# Patient Record
Sex: Female | Born: 1960 | Race: White | Hispanic: No | Marital: Married | Smoking: Former smoker
Health system: Southern US, Community
[De-identification: ages and names within clinical notes are randomized; demographics above are authoritative.]

## PROBLEM LIST (undated history)

## (undated) DIAGNOSIS — R112 Nausea with vomiting, unspecified: Secondary | ICD-10-CM

## (undated) DIAGNOSIS — F419 Anxiety disorder, unspecified: Secondary | ICD-10-CM

## (undated) DIAGNOSIS — Z9889 Other specified postprocedural states: Secondary | ICD-10-CM

## (undated) DIAGNOSIS — K219 Gastro-esophageal reflux disease without esophagitis: Secondary | ICD-10-CM

## (undated) DIAGNOSIS — K635 Polyp of colon: Secondary | ICD-10-CM

## (undated) HISTORY — DX: Anxiety disorder, unspecified: F41.9

## (undated) HISTORY — DX: Polyp of colon: K63.5

## (undated) HISTORY — DX: Gastro-esophageal reflux disease without esophagitis: K21.9

---

## 1999-06-16 ENCOUNTER — Other Ambulatory Visit: Admission: RE | Admit: 1999-06-16 | Discharge: 1999-06-16 | Payer: Self-pay | Admitting: Gynecology

## 1999-07-05 ENCOUNTER — Encounter (INDEPENDENT_AMBULATORY_CARE_PROVIDER_SITE_OTHER): Payer: Self-pay

## 1999-07-05 ENCOUNTER — Ambulatory Visit (HOSPITAL_COMMUNITY): Admission: RE | Admit: 1999-07-05 | Discharge: 1999-07-05 | Payer: Self-pay | Admitting: Gastroenterology

## 2000-10-24 ENCOUNTER — Encounter: Admission: RE | Admit: 2000-10-24 | Discharge: 2000-10-24 | Payer: Self-pay | Admitting: Gynecology

## 2000-10-24 ENCOUNTER — Encounter: Payer: Self-pay | Admitting: Gynecology

## 2002-05-22 ENCOUNTER — Ambulatory Visit (HOSPITAL_COMMUNITY): Admission: RE | Admit: 2002-05-22 | Discharge: 2002-05-22 | Payer: Self-pay | Admitting: Gastroenterology

## 2002-06-19 ENCOUNTER — Encounter: Payer: Self-pay | Admitting: Gynecology

## 2002-06-19 ENCOUNTER — Encounter: Admission: RE | Admit: 2002-06-19 | Discharge: 2002-06-19 | Payer: Self-pay | Admitting: Gynecology

## 2003-09-15 ENCOUNTER — Other Ambulatory Visit: Admission: RE | Admit: 2003-09-15 | Discharge: 2003-09-15 | Payer: Self-pay | Admitting: Family Medicine

## 2003-09-18 ENCOUNTER — Encounter: Admission: RE | Admit: 2003-09-18 | Discharge: 2003-09-18 | Payer: Self-pay | Admitting: Family Medicine

## 2004-09-27 ENCOUNTER — Encounter: Admission: RE | Admit: 2004-09-27 | Discharge: 2004-09-27 | Payer: Self-pay | Admitting: Family Medicine

## 2004-11-18 ENCOUNTER — Ambulatory Visit (HOSPITAL_BASED_OUTPATIENT_CLINIC_OR_DEPARTMENT_OTHER): Admission: RE | Admit: 2004-11-18 | Discharge: 2004-11-18 | Payer: Self-pay | Admitting: Orthopedic Surgery

## 2005-02-10 ENCOUNTER — Other Ambulatory Visit: Admission: RE | Admit: 2005-02-10 | Discharge: 2005-02-10 | Payer: Self-pay | Admitting: Family Medicine

## 2005-11-15 ENCOUNTER — Encounter: Admission: RE | Admit: 2005-11-15 | Discharge: 2005-11-15 | Payer: Self-pay | Admitting: Family Medicine

## 2007-01-29 ENCOUNTER — Encounter: Admission: RE | Admit: 2007-01-29 | Discharge: 2007-01-29 | Payer: Self-pay | Admitting: Family Medicine

## 2007-08-07 ENCOUNTER — Other Ambulatory Visit: Admission: RE | Admit: 2007-08-07 | Discharge: 2007-08-07 | Payer: Self-pay | Admitting: Family Medicine

## 2008-03-31 ENCOUNTER — Encounter: Admission: RE | Admit: 2008-03-31 | Discharge: 2008-03-31 | Payer: Self-pay | Admitting: Family Medicine

## 2008-10-10 HISTORY — PX: PARTIAL KNEE ARTHROPLASTY: SHX2174

## 2009-03-18 ENCOUNTER — Other Ambulatory Visit: Admission: RE | Admit: 2009-03-18 | Discharge: 2009-03-18 | Payer: Self-pay | Admitting: Family Medicine

## 2009-04-07 ENCOUNTER — Encounter: Admission: RE | Admit: 2009-04-07 | Discharge: 2009-04-07 | Payer: Self-pay | Admitting: Family Medicine

## 2010-04-27 ENCOUNTER — Encounter: Admission: RE | Admit: 2010-04-27 | Discharge: 2010-04-27 | Payer: Self-pay | Admitting: Family Medicine

## 2010-04-29 ENCOUNTER — Encounter: Admission: RE | Admit: 2010-04-29 | Discharge: 2010-04-29 | Payer: Self-pay | Admitting: Family Medicine

## 2010-05-04 ENCOUNTER — Other Ambulatory Visit: Admission: RE | Admit: 2010-05-04 | Discharge: 2010-05-04 | Payer: Self-pay | Admitting: Family Medicine

## 2011-02-25 NOTE — Op Note (Signed)
NAMEJANESSA, [name]               ACCOUNT NO.:  [id number]   MEDICAL RECORD NO.:  [id number]          PATIENT TYPE:  AMB   LOCATION:  DSC                          FACILITY:  [facility]   PHYSICIAN:  Nadara Mustard, MD     DATE OF BIRTH:  1961-03-29   DATE OF PROCEDURE:  11/18/2004  DATE OF DISCHARGE:                                 OPERATIVE REPORT   PREOPERATIVE DIAGNOSIS:  Internal derangement, right knee.   POSTOPERATIVE DIAGNOSES:  1.  Medial meniscal tear, right knee.  2.  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   PROCEDURE:  1.  Partial medial meniscectomy, right knee.  2.  Abrasion chondroplasty with micro fracture of the medial femoral condyle      and medial tibial plateau.   SURGEON:  Nadara Mustard, M.D.   ANESTHESIA:  Knee block.   ESTIMATED BLOOD LOSS:  Minimal.   ANTIBIOTICS:  1 g of Kefzol.   TOURNIQUET TIME:  None.   DISPOSITION:  To PACU in stable condition.   INDICATION FOR PROCEDURE:  The patient is a 50 year old woman who is active  athletically with tennis who has been having increasing mechanical symptoms  in her right knee.  The patient has been unable to perform activities of  daily living due to right knee pain, MRI scan confirms medial joint line  pathology and she presents at this time for arthroscopic intervention.  The  risks and benefits were discussed, including infection, neurovascular  injury, persistent pain, and need for additional surgery.  The patient  states she understands and wishes to proceed at this time.   DESCRIPTION OF PROCEDURE:  The patient was brought to OR room 5 after  undergoing a knee block.  After an adequate level of anesthesia was  obtained, the patient's right lower extremity was prepped using Duraprep and  draped into a sterile field.  The scope was inserted through the  inferolateral portal and an inferomedial working portal was established.  Visualization of  the medial femoral condyle showed a large osteochondral  defect of the medial femoral condyle and medial tibial plateau with multiple  loose bodies measuring greater than 10 x 10 mm in the medial joint line.  The loose bodies were first removed and abrasion chondroplasty was performed  on the medial femoral condyle and medial tibial plateau.  The patient had  eburnated bone.  Micro fracture technique was then used and holes were made  in the medial femoral condyle and medial tibial plateau.  The pressure was  released from the scope and there was good bleeding from the micro fracture  holes.  The patient also had a partial tear of the medial meniscus and this  was also debrided.  Visualization of the notch showed an intact ACL.  Visualization of the lateral joint line in a figure-of-four position showed  an intact lateral joint line.  Visualization of the patellofemoral joint  showed synovitis but an intact patella and trochlea.  The synovitis was  debrided, the medial and lateral gutters  were evaluated and there were no  loose bodies.  A survey of all three compartments was again performed, the  vapor Mitek was used for hemostasis and further debridement of the  synovitis.  The instruments were removed, the portals were injected with a  total of 20 cc of 0.5% Marcaine plain and 4 mg of morphine.  The portals  were closed using 4-0 nylon.  The wounds were covered with Adaptic,  orthopedic sponges, sterile Webril, and Coban dressing.  The patient was  then taken to PACU in stable condition with followup in the office in 2  weeks.  She has a prescription for Vicodin and Tylox for pain.      MVD/MEDQ  D:  11/18/2004  T:  11/18/2004  Job:  [id number]

## 2011-04-06 ENCOUNTER — Other Ambulatory Visit: Payer: Self-pay | Admitting: Family Medicine

## 2011-04-06 DIAGNOSIS — Z1231 Encounter for screening mammogram for malignant neoplasm of breast: Secondary | ICD-10-CM

## 2011-05-03 ENCOUNTER — Ambulatory Visit
Admission: RE | Admit: 2011-05-03 | Discharge: 2011-05-03 | Disposition: A | Discharge status: Home / Self Care | Payer: 59 | Source: Ambulatory Visit | Attending: Family Medicine | Admitting: Family Medicine

## 2011-05-03 DIAGNOSIS — Z1231 Encounter for screening mammogram for malignant neoplasm of breast: Secondary | ICD-10-CM

## 2011-10-31 ENCOUNTER — Other Ambulatory Visit: Payer: Self-pay | Admitting: Physician Assistant

## 2011-10-31 ENCOUNTER — Other Ambulatory Visit (HOSPITAL_COMMUNITY)
Admission: RE | Admit: 2011-10-31 | Discharge: 2011-10-31 | Disposition: A | Discharge status: Home / Self Care | Payer: 59 | Source: Ambulatory Visit | Attending: Family Medicine | Admitting: Family Medicine

## 2011-10-31 DIAGNOSIS — Z124 Encounter for screening for malignant neoplasm of cervix: Secondary | ICD-10-CM | POA: Insufficient documentation

## 2012-07-03 ENCOUNTER — Other Ambulatory Visit: Payer: Self-pay | Admitting: Family Medicine

## 2012-07-03 DIAGNOSIS — Z1231 Encounter for screening mammogram for malignant neoplasm of breast: Secondary | ICD-10-CM

## 2012-07-06 ENCOUNTER — Ambulatory Visit
Admission: RE | Admit: 2012-07-06 | Discharge: 2012-07-06 | Disposition: A | Discharge status: Home / Self Care | Payer: BC Managed Care – PPO | Source: Ambulatory Visit | Attending: Family Medicine | Admitting: Family Medicine

## 2012-07-06 DIAGNOSIS — Z1231 Encounter for screening mammogram for malignant neoplasm of breast: Secondary | ICD-10-CM

## 2012-12-27 ENCOUNTER — Other Ambulatory Visit: Payer: Self-pay | Admitting: Dermatology

## 2012-12-31 ENCOUNTER — Other Ambulatory Visit: Payer: Self-pay | Admitting: Physician Assistant

## 2012-12-31 ENCOUNTER — Other Ambulatory Visit (HOSPITAL_COMMUNITY)
Admission: RE | Admit: 2012-12-31 | Discharge: 2012-12-31 | Disposition: A | Discharge status: Home / Self Care | Payer: BC Managed Care – PPO | Source: Ambulatory Visit | Attending: Family Medicine | Admitting: Family Medicine

## 2012-12-31 DIAGNOSIS — Z124 Encounter for screening for malignant neoplasm of cervix: Secondary | ICD-10-CM | POA: Insufficient documentation

## 2013-03-06 ENCOUNTER — Other Ambulatory Visit: Payer: Self-pay | Admitting: Orthopedic Surgery

## 2013-03-06 DIAGNOSIS — M542 Cervicalgia: Secondary | ICD-10-CM

## 2013-03-19 ENCOUNTER — Other Ambulatory Visit: Payer: BC Managed Care – PPO

## 2013-03-21 ENCOUNTER — Other Ambulatory Visit: Payer: BC Managed Care – PPO

## 2013-03-23 ENCOUNTER — Other Ambulatory Visit: Payer: BC Managed Care – PPO

## 2013-04-01 ENCOUNTER — Ambulatory Visit
Admission: RE | Admit: 2013-04-01 | Discharge: 2013-04-01 | Disposition: A | Discharge status: Home / Self Care | Payer: BC Managed Care – PPO | Source: Ambulatory Visit | Attending: Orthopedic Surgery | Admitting: Orthopedic Surgery

## 2013-04-01 DIAGNOSIS — M542 Cervicalgia: Secondary | ICD-10-CM

## 2013-06-24 ENCOUNTER — Other Ambulatory Visit: Payer: Self-pay

## 2013-06-24 DIAGNOSIS — Z1231 Encounter for screening mammogram for malignant neoplasm of breast: Secondary | ICD-10-CM

## 2013-07-10 ENCOUNTER — Ambulatory Visit: Payer: BC Managed Care – PPO

## 2013-07-31 ENCOUNTER — Ambulatory Visit
Admission: RE | Admit: 2013-07-31 | Discharge: 2013-07-31 | Disposition: A | Discharge status: Home / Self Care | Payer: BC Managed Care – PPO | Source: Ambulatory Visit

## 2013-07-31 DIAGNOSIS — Z1231 Encounter for screening mammogram for malignant neoplasm of breast: Secondary | ICD-10-CM

## 2013-12-24 ENCOUNTER — Other Ambulatory Visit: Payer: Self-pay | Admitting: Family Medicine

## 2013-12-24 ENCOUNTER — Ambulatory Visit
Admission: RE | Admit: 2013-12-24 | Discharge: 2013-12-24 | Disposition: A | Discharge status: Home / Self Care | Payer: 59 | Source: Ambulatory Visit | Attending: Family Medicine | Admitting: Family Medicine

## 2013-12-24 DIAGNOSIS — Z87891 Personal history of nicotine dependence: Secondary | ICD-10-CM

## 2013-12-24 DIAGNOSIS — R079 Chest pain, unspecified: Secondary | ICD-10-CM

## 2013-12-26 ENCOUNTER — Ambulatory Visit
Admission: RE | Admit: 2013-12-26 | Discharge: 2013-12-26 | Disposition: A | Discharge status: Home / Self Care | Payer: 59 | Source: Ambulatory Visit | Attending: Family Medicine | Admitting: Family Medicine

## 2013-12-26 ENCOUNTER — Other Ambulatory Visit: Payer: Self-pay | Admitting: Family Medicine

## 2013-12-26 DIAGNOSIS — R911 Solitary pulmonary nodule: Secondary | ICD-10-CM

## 2013-12-26 MED ORDER — IOHEXOL 300 MG/ML  SOLN
75.0000 mL | Freq: Once | INTRAMUSCULAR | Status: AC | PRN
  Administered 2013-12-26: 75 mL via INTRAVENOUS

## 2014-01-09 ENCOUNTER — Other Ambulatory Visit: Payer: Self-pay | Admitting: Dermatology

## 2014-01-23 ENCOUNTER — Encounter: Payer: Self-pay | Admitting: *Deleted

## 2014-01-23 DIAGNOSIS — F419 Anxiety disorder, unspecified: Secondary | ICD-10-CM | POA: Insufficient documentation

## 2014-02-11 ENCOUNTER — Other Ambulatory Visit: Payer: Self-pay | Admitting: Dermatology

## 2014-07-21 ENCOUNTER — Other Ambulatory Visit: Payer: Self-pay

## 2014-07-21 DIAGNOSIS — Z1231 Encounter for screening mammogram for malignant neoplasm of breast: Secondary | ICD-10-CM

## 2014-08-12 ENCOUNTER — Ambulatory Visit
Admission: RE | Admit: 2014-08-12 | Discharge: 2014-08-12 | Disposition: A | Discharge status: Home / Self Care | Payer: 59 | Source: Ambulatory Visit

## 2014-08-12 DIAGNOSIS — Z1231 Encounter for screening mammogram for malignant neoplasm of breast: Secondary | ICD-10-CM

## 2015-02-26 ENCOUNTER — Other Ambulatory Visit: Payer: Self-pay | Admitting: Gastroenterology

## 2015-07-21 ENCOUNTER — Other Ambulatory Visit: Payer: Self-pay

## 2015-07-21 DIAGNOSIS — Z1231 Encounter for screening mammogram for malignant neoplasm of breast: Secondary | ICD-10-CM

## 2015-08-18 ENCOUNTER — Ambulatory Visit
Admission: RE | Admit: 2015-08-18 | Discharge: 2015-08-18 | Disposition: A | Discharge status: Home / Self Care | Payer: 59 | Source: Ambulatory Visit

## 2015-08-18 DIAGNOSIS — Z1231 Encounter for screening mammogram for malignant neoplasm of breast: Secondary | ICD-10-CM

## 2016-07-13 ENCOUNTER — Encounter (HOSPITAL_BASED_OUTPATIENT_CLINIC_OR_DEPARTMENT_OTHER): Payer: Self-pay | Admitting: *Deleted

## 2016-07-15 ENCOUNTER — Ambulatory Visit: Payer: Self-pay | Admitting: Plastic Surgery

## 2016-07-15 DIAGNOSIS — M898X8 Other specified disorders of bone, other site: Secondary | ICD-10-CM

## 2016-07-15 NOTE — H&P (Signed)
[name] is an 55 y.o. female.   Chief Complaint: forehead / skull mass HPI: The patient is a 55 yrs old wf here for a lesion on her forehead.  She is not sure how long it has been present but it is getting larger and tender.  It is at the midline upper forehead area.  It is 2 cm in size, raised and very firm.  It is not movable.  There is no sign of infection or redness around the area.  She denies any trauma.  Nothing makes it better.  She is concerned about the increase in size.  Past Medical History:  Diagnosis Date  . Anxiety   . Colon polyps   . GERD (gastroesophageal reflux disease)   . PONV (postoperative nausea and vomiting)     Past Surgical History:  Procedure Laterality Date  . PARTIAL KNEE ARTHROPLASTY Right 2010    Family History  Problem Relation Age of Onset  . Family history unknown: Yes   Social History:  reports that she quit smoking about 6 years ago. She has never used smokeless tobacco. She reports that she does not drink alcohol or use drugs.  Allergies:  Allergies  Allergen Reactions  . Codeine   . Penicillins      (Not in a hospital admission)  No results found for this or any previous visit (from the past 48 hour(s)). No results found.  Review of Systems  Constitutional: Negative.   HENT: Negative.   Eyes: Negative.   Respiratory: Negative.   Cardiovascular: Negative.   Gastrointestinal: Negative.   Genitourinary: Negative.   Musculoskeletal: Negative.   Skin: Negative.   Neurological: Negative.   Psychiatric/Behavioral: Negative.     There were no vitals taken for this visit. Physical Exam  Constitutional: She is oriented to person, place, and time. She appears well-developed and well-nourished.  HENT:  Head: Normocephalic and atraumatic.  Eyes: Conjunctivae and EOM are normal. Pupils are equal, round, and reactive to light.  Respiratory: Effort normal.  Neurological: She is alert and oriented to person, place, and time.   Skin: Skin is warm.  Psychiatric: She has a normal mood and affect. Her behavior is normal. Judgment and thought content normal.     Assessment/Plan Excision of skull / forehead mass  Wallace Going, DO 07/15/2016, 1:34 PM

## 2016-07-20 ENCOUNTER — Encounter (HOSPITAL_BASED_OUTPATIENT_CLINIC_OR_DEPARTMENT_OTHER): Payer: Self-pay | Admitting: *Deleted

## 2016-07-20 ENCOUNTER — Ambulatory Visit (HOSPITAL_BASED_OUTPATIENT_CLINIC_OR_DEPARTMENT_OTHER): Payer: 59 | Admitting: Anesthesiology

## 2016-07-20 ENCOUNTER — Ambulatory Visit (HOSPITAL_BASED_OUTPATIENT_CLINIC_OR_DEPARTMENT_OTHER)
Admission: RE | Admit: 2016-07-20 | Discharge: 2016-07-20 | Disposition: A | Discharge status: Home / Self Care | Payer: 59 | Source: Ambulatory Visit | Attending: Plastic Surgery | Admitting: Plastic Surgery

## 2016-07-20 ENCOUNTER — Encounter (HOSPITAL_BASED_OUTPATIENT_CLINIC_OR_DEPARTMENT_OTHER)
Admission: RE | Disposition: A | Discharge status: Home / Self Care | Payer: Self-pay | Source: Ambulatory Visit | Attending: Plastic Surgery

## 2016-07-20 DIAGNOSIS — E65 Localized adiposity: Secondary | ICD-10-CM | POA: Diagnosis not present

## 2016-07-20 DIAGNOSIS — Z87891 Personal history of nicotine dependence: Secondary | ICD-10-CM | POA: Insufficient documentation

## 2016-07-20 DIAGNOSIS — R220 Localized swelling, mass and lump, head: Secondary | ICD-10-CM | POA: Diagnosis present

## 2016-07-20 DIAGNOSIS — K219 Gastro-esophageal reflux disease without esophagitis: Secondary | ICD-10-CM | POA: Insufficient documentation

## 2016-07-20 DIAGNOSIS — M898X8 Other specified disorders of bone, other site: Secondary | ICD-10-CM

## 2016-07-20 HISTORY — PX: MASS EXCISION: SHX2000

## 2016-07-20 HISTORY — DX: Nausea with vomiting, unspecified: R11.2

## 2016-07-20 HISTORY — DX: Other specified postprocedural states: Z98.890

## 2016-07-20 SURGERY — EXCISION MASS
Anesthesia: General | Laterality: Right

## 2016-07-20 MED ORDER — LIDOCAINE-EPINEPHRINE 1 %-1:100000 IJ SOLN
INTRAMUSCULAR | Status: DC | PRN
  Administered 2016-07-20: 1 mL

## 2016-07-20 MED ORDER — CIPROFLOXACIN IN D5W 400 MG/200ML IV SOLN
INTRAVENOUS | Status: AC
  Filled 2016-07-20: qty 200

## 2016-07-20 MED ORDER — LIDOCAINE 2% (20 MG/ML) 5 ML SYRINGE
INTRAMUSCULAR | Status: AC
  Filled 2016-07-20: qty 5

## 2016-07-20 MED ORDER — GLYCOPYRROLATE 0.2 MG/ML IJ SOLN: 0.2000 mg | Freq: Once | INTRAMUSCULAR | Status: DC | PRN

## 2016-07-20 MED ORDER — CIPROFLOXACIN IN D5W 400 MG/200ML IV SOLN
400.0000 mg | INTRAVENOUS | Status: AC
  Administered 2016-07-20: 400 mg via INTRAVENOUS

## 2016-07-20 MED ORDER — SCOPOLAMINE 1 MG/3DAYS TD PT72: 1.0000 | MEDICATED_PATCH | Freq: Once | TRANSDERMAL | Status: DC | PRN

## 2016-07-20 MED ORDER — ONDANSETRON HCL 4 MG/2ML IJ SOLN
INTRAMUSCULAR | Status: DC | PRN
  Administered 2016-07-20: 4 mg via INTRAVENOUS

## 2016-07-20 MED ORDER — DEXAMETHASONE SODIUM PHOSPHATE 10 MG/ML IJ SOLN
INTRAMUSCULAR | Status: AC
  Filled 2016-07-20: qty 1

## 2016-07-20 MED ORDER — LIDOCAINE HCL (CARDIAC) 20 MG/ML IV SOLN
INTRAVENOUS | Status: DC | PRN
  Administered 2016-07-20: 60 mg via INTRAVENOUS

## 2016-07-20 MED ORDER — ONDANSETRON HCL 4 MG/2ML IJ SOLN
INTRAMUSCULAR | Status: AC
  Filled 2016-07-20: qty 2

## 2016-07-20 MED ORDER — MIDAZOLAM HCL 2 MG/2ML IJ SOLN
1.0000 mg | INTRAMUSCULAR | Status: DC | PRN
  Administered 2016-07-20: 2 mg via INTRAVENOUS

## 2016-07-20 MED ORDER — DEXAMETHASONE SODIUM PHOSPHATE 4 MG/ML IJ SOLN
INTRAMUSCULAR | Status: DC | PRN
  Administered 2016-07-20: 10 mg via INTRAVENOUS

## 2016-07-20 MED ORDER — ONDANSETRON HCL 4 MG/2ML IJ SOLN
4.0000 mg | Freq: Once | INTRAMUSCULAR | Status: DC | PRN
Start: 2016-07-20 — End: 2016-07-20

## 2016-07-20 MED ORDER — MIDAZOLAM HCL 2 MG/2ML IJ SOLN
INTRAMUSCULAR | Status: AC
  Filled 2016-07-20: qty 2

## 2016-07-20 MED ORDER — CHLORHEXIDINE GLUCONATE CLOTH 2 % EX PADS: 6.0000 | MEDICATED_PAD | Freq: Once | CUTANEOUS | Status: DC

## 2016-07-20 MED ORDER — PROPOFOL 10 MG/ML IV BOLUS
INTRAVENOUS | Status: AC
  Filled 2016-07-20: qty 20

## 2016-07-20 MED ORDER — HYDROMORPHONE HCL 1 MG/ML IJ SOLN: 0.2500 mg | INTRAMUSCULAR | Status: DC | PRN

## 2016-07-20 MED ORDER — FENTANYL CITRATE (PF) 100 MCG/2ML IJ SOLN
50.0000 ug | INTRAMUSCULAR | Status: DC | PRN
  Administered 2016-07-20: 100 ug via INTRAVENOUS

## 2016-07-20 MED ORDER — FENTANYL CITRATE (PF) 100 MCG/2ML IJ SOLN
INTRAMUSCULAR | Status: AC
  Filled 2016-07-20: qty 2

## 2016-07-20 MED ORDER — OXYCODONE HCL 5 MG PO TABS: 5.0000 mg | ORAL_TABLET | Freq: Once | ORAL | Status: DC | PRN

## 2016-07-20 MED ORDER — LACTATED RINGERS IV SOLN
INTRAVENOUS | Status: DC
  Administered 2016-07-20 (×2): via INTRAVENOUS

## 2016-07-20 MED ORDER — PROPOFOL 10 MG/ML IV BOLUS
INTRAVENOUS | Status: DC | PRN
  Administered 2016-07-20: 200 mg via INTRAVENOUS

## 2016-07-20 MED ORDER — OXYCODONE HCL 5 MG/5ML PO SOLN: 5.0000 mg | Freq: Once | ORAL | Status: DC | PRN

## 2016-07-20 MED ORDER — MEPERIDINE HCL 25 MG/ML IJ SOLN: 6.2500 mg | INTRAMUSCULAR | Status: DC | PRN

## 2016-07-20 SURGICAL SUPPLY — 65 items
ADH SKN CLS APL DERMABOND .7 (GAUZE/BANDAGES/DRESSINGS)
BENZOIN TINCTURE PRP APPL 2/3 (GAUZE/BANDAGES/DRESSINGS) IMPLANT
BLADE CLIPPER SURG (BLADE) IMPLANT
BLADE SURG 15 STRL LF DISP TIS (BLADE) ×1 IMPLANT
BLADE SURG 15 STRL SS (BLADE) ×3
BNDG CONFORM 2 STRL LF (GAUZE/BANDAGES/DRESSINGS) IMPLANT
BNDG ELASTIC 2X5.8 VLCR STR LF (GAUZE/BANDAGES/DRESSINGS) IMPLANT
CANISTER SUCT 1200ML W/VALVE (MISCELLANEOUS) IMPLANT
CHLORAPREP W/TINT 26ML (MISCELLANEOUS) IMPLANT
CLEANER CAUTERY TIP 5X5 PAD (MISCELLANEOUS) IMPLANT
CLOSURE WOUND 1/2 X4 (GAUZE/BANDAGES/DRESSINGS)
CORDS BIPOLAR (ELECTRODE) IMPLANT
COVER BACK TABLE 60X90IN (DRAPES) ×3 IMPLANT
COVER MAYO STAND STRL (DRAPES) ×3 IMPLANT
DECANTER SPIKE VIAL GLASS SM (MISCELLANEOUS) IMPLANT
DERMABOND ADVANCED (GAUZE/BANDAGES/DRESSINGS)
DERMABOND ADVANCED .7 DNX12 (GAUZE/BANDAGES/DRESSINGS) IMPLANT
DRAPE LAPAROTOMY 100X72 PEDS (DRAPES) IMPLANT
DRAPE U-SHAPE 76X120 STRL (DRAPES) ×3 IMPLANT
DRSG TEGADERM 2-3/8X2-3/4 SM (GAUZE/BANDAGES/DRESSINGS) IMPLANT
DRSG TEGADERM 4X4.75 (GAUZE/BANDAGES/DRESSINGS) IMPLANT
ELECT COATED BLADE 2.86 ST (ELECTRODE) IMPLANT
ELECT NEEDLE BLADE 2-5/6 (NEEDLE) IMPLANT
ELECT REM PT RETURN 9FT ADLT (ELECTROSURGICAL)
ELECT REM PT RETURN 9FT PED (ELECTROSURGICAL)
ELECTRODE REM PT RETRN 9FT PED (ELECTROSURGICAL) IMPLANT
ELECTRODE REM PT RTRN 9FT ADLT (ELECTROSURGICAL) IMPLANT
GLOVE BIO SURGEON STRL SZ 6.5 (GLOVE) ×4 IMPLANT
GLOVE BIO SURGEONS STRL SZ 6.5 (GLOVE) ×2
GOWN STRL REUS W/ TWL LRG LVL3 (GOWN DISPOSABLE) ×2 IMPLANT
GOWN STRL REUS W/TWL LRG LVL3 (GOWN DISPOSABLE) ×6
NEEDLE HYPO 30GX1 BEV (NEEDLE) IMPLANT
NEEDLE PRECISIONGLIDE 27X1.5 (NEEDLE) ×3 IMPLANT
NS IRRIG 1000ML POUR BTL (IV SOLUTION) IMPLANT
PACK BASIN DAY SURGERY FS (CUSTOM PROCEDURE TRAY) ×3 IMPLANT
PAD CLEANER CAUTERY TIP 5X5 (MISCELLANEOUS)
PENCIL BUTTON HOLSTER BLD 10FT (ELECTRODE) IMPLANT
RUBBERBAND STERILE (MISCELLANEOUS) IMPLANT
SHEET MEDIUM DRAPE 40X70 STRL (DRAPES) IMPLANT
SLEEVE SCD COMPRESS KNEE MED (MISCELLANEOUS) IMPLANT
SPONGE GAUZE 2X2 8PLY STER LF (GAUZE/BANDAGES/DRESSINGS)
SPONGE GAUZE 2X2 8PLY STRL LF (GAUZE/BANDAGES/DRESSINGS) IMPLANT
SPONGE GAUZE 4X4 12PLY STER LF (GAUZE/BANDAGES/DRESSINGS) IMPLANT
STRIP CLOSURE SKIN 1/2X4 (GAUZE/BANDAGES/DRESSINGS) IMPLANT
SUCTION FRAZIER HANDLE 10FR (MISCELLANEOUS)
SUCTION TUBE FRAZIER 10FR DISP (MISCELLANEOUS) IMPLANT
SUT MNCRL 6-0 UNDY P1 1X18 (SUTURE) IMPLANT
SUT MNCRL AB 3-0 PS2 18 (SUTURE) IMPLANT
SUT MNCRL AB 4-0 PS2 18 (SUTURE) IMPLANT
SUT MON AB 5-0 P3 18 (SUTURE) IMPLANT
SUT MON AB 5-0 PS2 18 (SUTURE) IMPLANT
SUT MONOCRYL 6-0 P1 1X18 (SUTURE)
SUT PROLENE 5 0 P 3 (SUTURE) IMPLANT
SUT PROLENE 5 0 PS 2 (SUTURE) IMPLANT
SUT PROLENE 6 0 P 1 18 (SUTURE) IMPLANT
SUT VIC AB 5-0 P-3 18X BRD (SUTURE) IMPLANT
SUT VIC AB 5-0 P3 18 (SUTURE)
SUT VIC AB 5-0 PS2 18 (SUTURE) IMPLANT
SUT VICRYL 4-0 PS2 18IN ABS (SUTURE) IMPLANT
SYR BULB 3OZ (MISCELLANEOUS) IMPLANT
SYR CONTROL 10ML LL (SYRINGE) ×3 IMPLANT
TOWEL OR 17X24 6PK STRL BLUE (TOWEL DISPOSABLE) ×3 IMPLANT
TRAY DSU PREP LF (CUSTOM PROCEDURE TRAY) ×3 IMPLANT
TUBE CONNECTING 20'X1/4 (TUBING)
TUBE CONNECTING 20X1/4 (TUBING) IMPLANT

## 2016-07-20 NOTE — Interval H&P Note (Signed)
History and Physical Interval Note:  07/20/2016 12:25 PM  [name]  has presented today for surgery, with the diagnosis of FOREHEAD MASS  The various methods of treatment have been discussed with the patient and family. After consideration of risks, benefits and other options for treatment, the patient has consented to  Procedure(s): EXCISION  OF FOREHEAD BONE MASS (Right) as a surgical intervention .  The patient's history has been reviewed, patient examined, no change in status, stable for surgery.  I have reviewed the patient's chart and labs.  Questions were answered to the patient's satisfaction.     Wallace Going

## 2016-07-20 NOTE — H&P (View-Only) (Signed)
[name] is an 55 y.o. female.   Chief Complaint: forehead / skull mass HPI: The patient is a 55 yrs old wf here for a lesion on her forehead.  She is not sure how long it has been present but it is getting larger and tender.  It is at the midline upper forehead area.  It is 2 cm in size, raised and very firm.  It is not movable.  There is no sign of infection or redness around the area.  She denies any trauma.  Nothing makes it better.  She is concerned about the increase in size.  Past Medical History:  Diagnosis Date  . Anxiety   . Colon polyps   . GERD (gastroesophageal reflux disease)   . PONV (postoperative nausea and vomiting)     Past Surgical History:  Procedure Laterality Date  . PARTIAL KNEE ARTHROPLASTY Right 2010    Family History  Problem Relation Age of Onset  . Family history unknown: Yes   Social History:  reports that she quit smoking about 6 years ago. She has never used smokeless tobacco. She reports that she does not drink alcohol or use drugs.  Allergies:  Allergies  Allergen Reactions  . Codeine   . Penicillins      (Not in a hospital admission)  No results found for this or any previous visit (from the past 48 hour(s)). No results found.  Review of Systems  Constitutional: Negative.   HENT: Negative.   Eyes: Negative.   Respiratory: Negative.   Cardiovascular: Negative.   Gastrointestinal: Negative.   Genitourinary: Negative.   Musculoskeletal: Negative.   Skin: Negative.   Neurological: Negative.   Psychiatric/Behavioral: Negative.     There were no vitals taken for this visit. Physical Exam  Constitutional: She is oriented to person, place, and time. She appears well-developed and well-nourished.  HENT:  Head: Normocephalic and atraumatic.  Eyes: Conjunctivae and EOM are normal. Pupils are equal, round, and reactive to light.  Respiratory: Effort normal.  Neurological: She is alert and oriented to person, place, and time.   Skin: Skin is warm.  Psychiatric: She has a normal mood and affect. Her behavior is normal. Judgment and thought content normal.     Assessment/Plan Excision of skull / forehead mass  Wallace Going, DO 07/15/2016, 1:34 PM

## 2016-07-20 NOTE — Anesthesia Procedure Notes (Signed)
Procedure Name: LMA Insertion Date/Time: 07/20/2016 12:54 PM Performed by: Maryella Shivers Pre-anesthesia Checklist: Patient identified, Emergency Drugs available, Suction available and Patient being monitored Patient Re-evaluated:Patient Re-evaluated prior to inductionOxygen Delivery Method: Circle system utilized Preoxygenation: Pre-oxygenation with 100% oxygen Intubation Type: IV induction Ventilation: Mask ventilation without difficulty LMA: LMA flexible inserted LMA Size: 4.0 Number of attempts: 1 Airway Equipment and Method: Bite block Placement Confirmation: positive ETCO2 Tube secured with: Tape Dental Injury: Teeth and Oropharynx as per pre-operative assessment

## 2016-07-20 NOTE — Op Note (Signed)
DATE OF OPERATION: 07/20/2016  LOCATION: [facility]  PREOPERATIVE DIAGNOSIS: forehead mass  POSTOPERATIVE DIAGNOSIS: Same  PROCEDURE: Excision of bone forehead mass 1.5 cm  SURGEON: Krishav Mamone Sanger Bessie Boyte, DO  ASSISTANT: Shawn Rayburn, PA  EBL: minimal  CONDITION: Stable  COMPLICATIONS: None  INDICATION: The patient, [name], is a 55 y.o. female born on 1961-09-12, is here for treatment a chronic bump on her forehead.  It started to get larger and tender.   PROCEDURE DETAILS:  The patient was seen on the morning of her surgery and marked out for her flap.  The IV antibiotics were given. The patient was taken to the operating room and given a general anesthetic. A standard time out was performed and all information was confirmed by those in the room. SCDs were placed.   The local was injected in the skin at the site.  The #15 blade was used to make an incision over the lesion.  The dingman was used to dissect around the 1.5 cm lesion.  It was hard and felt like bone.  The bone lifter was used to free it from the skull.  The osteotome was used to be sure the remaining bone was flat.  There was no intracranial connection or softness of the bone. The 6-0 Monocryl was used to close the skin with a running subcuticular stitch.  Dermabond and a steri strip was applied.The patient was allowed to wake up and taken to recovery room in stable condition at the end of the case. The family was notified at the end of the case.

## 2016-07-20 NOTE — Anesthesia Postprocedure Evaluation (Signed)
Anesthesia Post Note  Patient: [name]  Procedure(s) Performed: Procedure(s) (LRB): EXCISION  OF FOREHEAD BONE MASS (Right)  Patient location during evaluation: PACU Anesthesia Type: General Level of consciousness: awake and alert Pain management: pain level controlled Vital Signs Assessment: post-procedure vital signs reviewed and stable Respiratory status: spontaneous breathing, nonlabored ventilation and respiratory function stable Cardiovascular status: blood pressure returned to baseline and stable Postop Assessment: no signs of nausea or vomiting Anesthetic complications: no    Last Vitals:  Vitals:   07/20/16 1345 07/20/16 1400  BP: 116/74 121/76  Pulse: (!) 59 70  Resp: 13 11  Temp:      Last Pain:  Vitals:   07/20/16 1330  TempSrc:   PainSc: 0-No pain                 Jona Zappone A

## 2016-07-20 NOTE — Brief Op Note (Signed)
07/20/2016  12:53 PM  PATIENT:  [name]  55 y.o. female  PRE-OPERATIVE DIAGNOSIS:  FOREHEAD MASS  POST-OPERATIVE DIAGNOSIS:  FOREHEAD MASS  PROCEDURE:  Procedure(s): EXCISION  OF FOREHEAD BONE MASS (Right)  SURGEON:  Surgeon(s) and Role:    * Loel Lofty Grayson White, DO - Primary  PHYSICIAN ASSISTANT:  Shawn Rayburn, PA  ASSISTANTS: none   ANESTHESIA:   local and general  EBL:  No intake/output data recorded.  BLOOD ADMINISTERED:none  DRAINS: none   LOCAL MEDICATIONS USED:  LIDOCAINE   SPECIMEN:  Source of Specimen:  forehead bone  DISPOSITION OF SPECIMEN:  PATHOLOGY  COUNTS:  YES  TOURNIQUET:  * No tourniquets in log *  DICTATION: .Dragon Dictation  PLAN OF CARE: Discharge to home after PACU  PATIENT DISPOSITION:  PACU - hemodynamically stable.   Delay start of Pharmacological VTE agent (>24hrs) due to surgical blood loss or risk of bleeding: no

## 2016-07-20 NOTE — Anesthesia Preprocedure Evaluation (Signed)
Anesthesia Evaluation  Patient identified by MRN, date of birth, ID band Patient awake    Reviewed: Allergy & Precautions  Airway Mallampati: I  TM Distance: >3 FB Neck ROM: Full    Dental  (+) Teeth Intact, Dental Advisory Given   Pulmonary former smoker,    breath sounds clear to auscultation       Cardiovascular  Rhythm:Regular Rate:Normal     Neuro/Psych    GI/Hepatic GERD  Medicated and Controlled,  Endo/Other    Renal/GU      Musculoskeletal   Abdominal   Peds  Hematology   Anesthesia Other Findings PONV was when she had a C section.  Reproductive/Obstetrics                             Anesthesia Physical Anesthesia Plan  ASA: II  Anesthesia Plan: General   Post-op Pain Management:    Induction: Intravenous  Airway Management Planned: LMA  Additional Equipment:   Intra-op Plan:   Post-operative Plan: Extubation in OR  Informed Consent: I have reviewed the patients History and Physical, chart, labs and discussed the procedure including the risks, benefits and alternatives for the proposed anesthesia with the patient or authorized representative who has indicated his/her understanding and acceptance.   Dental advisory given  Plan Discussed with: CRNA, Anesthesiologist and Surgeon  Anesthesia Plan Comments:         Anesthesia Quick Evaluation

## 2016-07-20 NOTE — Transfer of Care (Signed)
Immediate Anesthesia Transfer of Care Note  Patient: [name]  Procedure(s) Performed: Procedure(s): EXCISION  OF FOREHEAD BONE MASS (Right)  Patient Location: PACU  Anesthesia Type:General  Level of Consciousness: sedated  Airway & Oxygen Therapy: Patient Spontanous Breathing and Patient connected to face mask oxygen  Post-op Assessment: Report given to RN and Post -op Vital signs reviewed and stable  Post vital signs: Reviewed and stable  Last Vitals:  Vitals:   07/20/16 1152  BP: 138/82  Pulse: 77  Resp: 18  Temp: 36.5 C    Last Pain:  Vitals:   07/20/16 1152  TempSrc: Oral         Complications: No apparent anesthesia complications

## 2016-07-20 NOTE — Discharge Instructions (Signed)
Head of bed elevated No heavy lifting Ice pack as able for today   Post Anesthesia Home Care Instructions  Activity: Get plenty of rest for the remainder of the day. A responsible adult should stay with you for 24 hours following the procedure.  For the next 24 hours, DO NOT: -Drive a car -Paediatric nurse -Drink alcoholic beverages -Take any medication unless instructed by your physician -Make any legal decisions or sign important papers.  Meals: Start with liquid foods such as gelatin or soup. Progress to regular foods as tolerated. Avoid greasy, spicy, heavy foods. If nausea and/or vomiting occur, drink only clear liquids until the nausea and/or vomiting subsides. Call your physician if vomiting continues.  Special Instructions/Symptoms: Your throat may feel dry or sore from the anesthesia or the breathing tube placed in your throat during surgery. If this causes discomfort, gargle with warm salt water. The discomfort should disappear within 24 hours.  If you had a scopolamine patch placed behind your ear for the management of post- operative nausea and/or vomiting:  1. The medication in the patch is effective for 72 hours, after which it should be removed.  Wrap patch in a tissue and discard in the trash. Wash hands thoroughly with soap and water. 2. You may remove the patch earlier than 72 hours if you experience unpleasant side effects which may include dry mouth, dizziness or visual disturbances. 3. Avoid touching the patch. Wash your hands with soap and water after contact with the patch.

## 2016-07-21 ENCOUNTER — Encounter (HOSPITAL_BASED_OUTPATIENT_CLINIC_OR_DEPARTMENT_OTHER): Payer: Self-pay | Admitting: Plastic Surgery

## 2016-07-29 ENCOUNTER — Other Ambulatory Visit: Payer: Self-pay | Admitting: Family Medicine

## 2016-07-29 DIAGNOSIS — Z1231 Encounter for screening mammogram for malignant neoplasm of breast: Secondary | ICD-10-CM

## 2016-08-15 ENCOUNTER — Other Ambulatory Visit: Payer: Self-pay | Admitting: Family Medicine

## 2016-08-15 ENCOUNTER — Other Ambulatory Visit (HOSPITAL_COMMUNITY)
Admission: RE | Admit: 2016-08-15 | Discharge: 2016-08-15 | Disposition: A | Discharge status: Home / Self Care | Payer: 59 | Source: Ambulatory Visit | Attending: Family Medicine | Admitting: Family Medicine

## 2016-08-15 DIAGNOSIS — Z124 Encounter for screening for malignant neoplasm of cervix: Secondary | ICD-10-CM | POA: Diagnosis not present

## 2016-08-17 LAB — CYTOLOGY - PAP: DIAGNOSIS: NEGATIVE

## 2016-08-23 ENCOUNTER — Ambulatory Visit
Admission: RE | Admit: 2016-08-23 | Discharge: 2016-08-23 | Disposition: A | Discharge status: Home / Self Care | Payer: 59 | Source: Ambulatory Visit | Attending: Family Medicine | Admitting: Family Medicine

## 2016-08-23 DIAGNOSIS — Z1231 Encounter for screening mammogram for malignant neoplasm of breast: Secondary | ICD-10-CM

## 2016-12-01 ENCOUNTER — Other Ambulatory Visit: Payer: Self-pay | Admitting: Ophthalmology

## 2016-12-01 DIAGNOSIS — S0011XA Contusion of right eyelid and periocular area, initial encounter: Secondary | ICD-10-CM

## 2016-12-06 ENCOUNTER — Other Ambulatory Visit: Payer: Self-pay | Admitting: Ophthalmology

## 2016-12-06 DIAGNOSIS — S0011XA Contusion of right eyelid and periocular area, initial encounter: Secondary | ICD-10-CM

## 2016-12-06 DIAGNOSIS — D181 Lymphangioma, any site: Secondary | ICD-10-CM

## 2016-12-07 ENCOUNTER — Ambulatory Visit
Admission: RE | Admit: 2016-12-07 | Discharge: 2016-12-07 | Disposition: A | Discharge status: Home / Self Care | Payer: 59 | Source: Ambulatory Visit | Attending: Ophthalmology | Admitting: Ophthalmology

## 2016-12-07 DIAGNOSIS — S0011XA Contusion of right eyelid and periocular area, initial encounter: Secondary | ICD-10-CM

## 2016-12-07 DIAGNOSIS — D181 Lymphangioma, any site: Secondary | ICD-10-CM

## 2016-12-07 MED ORDER — IOPAMIDOL (ISOVUE-300) INJECTION 61%
75.0000 mL | Freq: Once | INTRAVENOUS | Status: AC | PRN
  Administered 2016-12-07: 75 mL via INTRAVENOUS

## 2017-07-19 ENCOUNTER — Other Ambulatory Visit: Payer: Self-pay | Admitting: Family Medicine

## 2017-07-19 DIAGNOSIS — Z1231 Encounter for screening mammogram for malignant neoplasm of breast: Secondary | ICD-10-CM

## 2017-08-24 ENCOUNTER — Ambulatory Visit
Admission: RE | Admit: 2017-08-24 | Discharge: 2017-08-24 | Disposition: A | Discharge status: Home / Self Care | Payer: 59 | Source: Ambulatory Visit | Attending: Family Medicine | Admitting: Family Medicine

## 2017-08-24 DIAGNOSIS — Z1231 Encounter for screening mammogram for malignant neoplasm of breast: Secondary | ICD-10-CM

## 2018-07-25 ENCOUNTER — Other Ambulatory Visit: Payer: Self-pay | Admitting: Family Medicine

## 2018-07-25 DIAGNOSIS — Z1231 Encounter for screening mammogram for malignant neoplasm of breast: Secondary | ICD-10-CM

## 2018-08-30 ENCOUNTER — Ambulatory Visit
Admission: RE | Admit: 2018-08-30 | Discharge: 2018-08-30 | Disposition: A | Discharge status: Home / Self Care | Payer: 59 | Source: Ambulatory Visit | Attending: Family Medicine | Admitting: Family Medicine

## 2018-08-30 DIAGNOSIS — Z1231 Encounter for screening mammogram for malignant neoplasm of breast: Secondary | ICD-10-CM

## 2019-06-18 ENCOUNTER — Other Ambulatory Visit: Payer: Self-pay

## 2019-06-18 DIAGNOSIS — Z20822 Contact with and (suspected) exposure to covid-19: Secondary | ICD-10-CM

## 2019-06-20 LAB — NOVEL CORONAVIRUS, NAA: SARS-CoV-2, NAA: NOT DETECTED

## 2019-08-13 ENCOUNTER — Other Ambulatory Visit: Payer: Self-pay

## 2019-08-13 DIAGNOSIS — Z20822 Contact with and (suspected) exposure to covid-19: Secondary | ICD-10-CM

## 2019-08-15 LAB — NOVEL CORONAVIRUS, NAA: SARS-CoV-2, NAA: NOT DETECTED

## 2019-09-11 ENCOUNTER — Other Ambulatory Visit: Payer: Self-pay | Admitting: Family Medicine

## 2019-09-11 DIAGNOSIS — Z1231 Encounter for screening mammogram for malignant neoplasm of breast: Secondary | ICD-10-CM

## 2019-10-15 ENCOUNTER — Ambulatory Visit: Payer: PRIVATE HEALTH INSURANCE | Attending: Internal Medicine

## 2019-10-15 DIAGNOSIS — Z20822 Contact with and (suspected) exposure to covid-19: Secondary | ICD-10-CM

## 2019-10-15 DIAGNOSIS — U071 COVID-19: Secondary | ICD-10-CM | POA: Insufficient documentation

## 2019-10-16 LAB — NOVEL CORONAVIRUS, NAA: SARS-CoV-2, NAA: DETECTED — AB

## 2019-10-31 ENCOUNTER — Other Ambulatory Visit: Payer: Self-pay

## 2019-10-31 ENCOUNTER — Ambulatory Visit
Admission: RE | Admit: 2019-10-31 | Discharge: 2019-10-31 | Disposition: A | Discharge status: Home / Self Care | Payer: PRIVATE HEALTH INSURANCE | Source: Ambulatory Visit | Attending: Family Medicine | Admitting: Family Medicine

## 2019-10-31 DIAGNOSIS — Z1231 Encounter for screening mammogram for malignant neoplasm of breast: Secondary | ICD-10-CM

## 2019-12-20 ENCOUNTER — Ambulatory Visit: Payer: 59 | Attending: Internal Medicine

## 2019-12-20 DIAGNOSIS — Z20822 Contact with and (suspected) exposure to covid-19: Secondary | ICD-10-CM

## 2019-12-21 LAB — NOVEL CORONAVIRUS, NAA: SARS-CoV-2, NAA: NOT DETECTED

## 2020-01-20 ENCOUNTER — Ambulatory Visit: Payer: Self-pay | Attending: Internal Medicine

## 2020-01-20 DIAGNOSIS — Z23 Encounter for immunization: Secondary | ICD-10-CM

## 2020-01-20 NOTE — Progress Notes (Signed)
   Covid-19 Vaccination Clinic  Name:  [name]    MRN: [id number] DOB: Aug 04, 1961  01/20/2020  [name] was observed post Covid-19 immunization for 30 minutes based on pre-vaccination screening without incident. She was provided with Vaccine Information Sheet and instruction to access the V-Safe system.   [name] was instructed to call 911 with any severe reactions post vaccine: [name] Kitchen Difficulty breathing  . Swelling of face and throat  . A fast heartbeat  . A bad rash all over body  . Dizziness and weakness   Immunizations Administered    Name Date Dose VIS Date Route   Pfizer COVID-19 Vaccine 01/20/2020 12:03 PM 0.3 mL 09/20/2019 Intramuscular   Manufacturer: [place]   Lot: [id number]   [place]: [id number]

## 2020-02-17 ENCOUNTER — Ambulatory Visit: Payer: Self-pay | Attending: Internal Medicine

## 2020-02-17 DIAGNOSIS — Z23 Encounter for immunization: Secondary | ICD-10-CM

## 2020-02-17 NOTE — Progress Notes (Signed)
   Covid-19 Vaccination Clinic  Name:  [name]    MRN: [id number] DOB: Mar 24, 1961  02/17/2020  [name] was observed post Covid-19 immunization for 15 minutes without incident. She was provided with Vaccine Information Sheet and instruction to access the V-Safe system.   [name] was instructed to call 911 with any severe reactions post vaccine: [name] Kitchen Difficulty breathing  . Swelling of face and throat  . A fast heartbeat  . A bad rash all over body  . Dizziness and weakness   Immunizations Administered    Name Date Dose VIS Date Route   Pfizer COVID-19 Vaccine 02/17/2020 10:16 AM 0.3 mL 12/04/2018 Intramuscular   Manufacturer: [place]   Lot: [id number]   [place]: [id number]

## 2020-05-11 ENCOUNTER — Other Ambulatory Visit (HOSPITAL_COMMUNITY)
Admission: RE | Admit: 2020-05-11 | Discharge: 2020-05-11 | Disposition: A | Discharge status: Home / Self Care | Payer: Self-pay | Source: Ambulatory Visit | Attending: Family Medicine | Admitting: Family Medicine

## 2020-05-11 DIAGNOSIS — Z124 Encounter for screening for malignant neoplasm of cervix: Secondary | ICD-10-CM | POA: Insufficient documentation

## 2020-05-12 ENCOUNTER — Other Ambulatory Visit: Payer: Self-pay | Admitting: Family Medicine

## 2020-05-12 DIAGNOSIS — E2839 Other primary ovarian failure: Secondary | ICD-10-CM

## 2020-05-12 LAB — CYTOLOGY - PAP: Diagnosis: NEGATIVE

## 2020-08-10 ENCOUNTER — Other Ambulatory Visit: Payer: Self-pay

## 2020-08-10 ENCOUNTER — Ambulatory Visit
Admission: RE | Admit: 2020-08-10 | Discharge: 2020-08-10 | Disposition: A | Discharge status: Home / Self Care | Payer: 59 | Source: Ambulatory Visit | Attending: Family Medicine | Admitting: Family Medicine

## 2020-08-10 DIAGNOSIS — E2839 Other primary ovarian failure: Secondary | ICD-10-CM

## 2020-09-18 ENCOUNTER — Other Ambulatory Visit: Payer: Self-pay | Admitting: Orthopedic Surgery

## 2020-09-18 MED ORDER — METHOCARBAMOL 500 MG PO TABS: 500.0000 mg | ORAL_TABLET | Freq: Four times a day (QID) | ORAL | 0 refills | Status: DC | PRN

## 2020-09-18 MED ORDER — PREDNISONE 10 MG PO TABS: 20.0000 mg | ORAL_TABLET | Freq: Every day | ORAL | 0 refills | Status: DC

## 2020-09-21 ENCOUNTER — Encounter: Payer: Self-pay | Admitting: Orthopedic Surgery

## 2020-09-21 ENCOUNTER — Ambulatory Visit (INDEPENDENT_AMBULATORY_CARE_PROVIDER_SITE_OTHER): Payer: 59

## 2020-09-21 ENCOUNTER — Ambulatory Visit: Payer: 59 | Admitting: Orthopedic Surgery

## 2020-09-21 DIAGNOSIS — M5442 Lumbago with sciatica, left side: Secondary | ICD-10-CM | POA: Diagnosis not present

## 2020-09-21 DIAGNOSIS — G8929 Other chronic pain: Secondary | ICD-10-CM

## 2020-09-21 DIAGNOSIS — M4316 Spondylolisthesis, lumbar region: Secondary | ICD-10-CM

## 2020-09-21 NOTE — Progress Notes (Signed)
Office Visit Note   Patient: [name]           Date of Birth: 1961-02-15           MRN: [id number] Visit Date: 09/21/2020              Requested by: Kelton Pillar, MD [address],  [place] [address] PCP: Kelton Pillar, MD  Chief Complaint  Patient presents with  . Lower Back - Pain      HPI: Patient is a 59 year old woman who presents with left-sided radicular pain to the lateral ankle as well as excruciating lower back pain.  She states she took 60 mg of prednisone Saturday without relief she has taken 20 mg of prednisone today she is also used topical lotions as well as heat.  She denies any relief with using the Robaxin.  Assessment & Plan: Visit Diagnoses:  1. Chronic midline low back pain with left-sided sciatica   2. Spondylolisthesis, lumbar region     Plan: We will request an MRI scan to evaluate the spondylolisthesis at L4-5.  Continue the prednisone 10 to 20 mg with breakfast continue with topical treatment including Voltaren gel and continue with heat.  Follow-Up Instructions: Return if symptoms worsen or fail to improve, for Follow-up after MRI scan.   Ortho Exam  Patient is alert, oriented, no adenopathy, well-dressed, normal affect, normal respiratory effort. Examination patient has antalgic gait she has a negative straight leg raise on the left with no focal motor weakness in the left lower extremity.  No pain with range of motion of the hip knee or ankle.  Patient has radicular pain to the lateral left ankle.  Radiographs show a spondylolisthesis at L4-5.  Imaging: XR Lumbar Spine 2-3 Views  Result Date: 09/21/2020 2 view radiographs of the lumbar spine shows disc space narrowing as well as a spondylolisthesis at L4-5.  No images are attached to the encounter.  Labs: No results found for: HGBA1C, ESRSEDRATE, CRP, LABURIC, REPTSTATUS, GRAMSTAIN, CULT, LABORGA   No results found for: ALBUMIN, PREALBUMIN,  LABURIC  No results found for: MG No results found for: VD25OH  No results found for: PREALBUMIN No flowsheet data found.   There is no height or weight on file to calculate BMI.  Orders:  Orders Placed This Encounter  Procedures  . XR Lumbar Spine 2-3 Views   No orders of the defined types were placed in this encounter.    Procedures: No procedures performed  Clinical Data: No additional findings.  ROS:  All other systems negative, except as noted in the HPI. Review of Systems  Objective: Vital Signs: There were no vitals taken for this visit.  Specialty Comments:  No specialty comments available.  PMFS History: Patient Active Problem List   Diagnosis Date Noted  . Anxiety    Past Medical History:  Diagnosis Date  . Anxiety   . Colon polyps   . GERD (gastroesophageal reflux disease)   . PONV (postoperative nausea and vomiting)     Family History  Family history unknown: Yes    Past Surgical History:  Procedure Laterality Date  . MASS EXCISION Right 07/20/2016   Procedure: EXCISION  OF FOREHEAD BONE MASS;  Surgeon: Wallace Going, DO;  Location: [facility];  Service: Plastics;  Laterality: Right;  . PARTIAL KNEE ARTHROPLASTY Right 2010   Social History   Occupational History  . Not on file  Tobacco Use  . Smoking status:  Former Smoker    Quit date: 01/23/2010    Years since quitting: 10.6  . Smokeless tobacco: Never Used  Substance and Sexual Activity  . Alcohol use: No  . Drug use: No  . Sexual activity: Not on file

## 2020-09-22 ENCOUNTER — Ambulatory Visit: Payer: 59 | Admitting: Orthopedic Surgery

## 2020-10-14 ENCOUNTER — Other Ambulatory Visit: Payer: Self-pay

## 2020-10-14 DIAGNOSIS — M4316 Spondylolisthesis, lumbar region: Secondary | ICD-10-CM

## 2020-10-14 DIAGNOSIS — G8929 Other chronic pain: Secondary | ICD-10-CM

## 2020-10-15 ENCOUNTER — Other Ambulatory Visit: Payer: Self-pay | Admitting: Orthopedic Surgery

## 2020-10-16 ENCOUNTER — Other Ambulatory Visit: Payer: 59

## 2020-10-30 ENCOUNTER — Other Ambulatory Visit: Payer: Self-pay | Admitting: Family Medicine

## 2020-10-30 DIAGNOSIS — Z1231 Encounter for screening mammogram for malignant neoplasm of breast: Secondary | ICD-10-CM

## 2020-11-05 ENCOUNTER — Ambulatory Visit: Payer: 59 | Attending: Orthopedic Surgery

## 2020-11-05 ENCOUNTER — Other Ambulatory Visit: Payer: Self-pay

## 2020-11-05 DIAGNOSIS — M5442 Lumbago with sciatica, left side: Secondary | ICD-10-CM | POA: Insufficient documentation

## 2020-11-05 DIAGNOSIS — M4316 Spondylolisthesis, lumbar region: Secondary | ICD-10-CM | POA: Diagnosis present

## 2020-11-05 NOTE — Therapy (Signed)
[place], [place], [address] Phone: [phone]   Fax:  [phone]  Physical Therapy Evaluation  Patient Details  Name: [name] MRN: [id number] Date of Birth: June 21, 1961 Referring Provider (PT): Meridee Score, MD   Encounter Date: 11/05/2020   PT End of Session - 11/05/20 1750    Visit Number 1    Number of Visits 6    Date for PT Re-Evaluation 12/31/20    Authorization Type [employer]    PT Start Time 1417    PT Stop Time 1503    PT Time Calculation (min) 46 min    Activity Tolerance Patient tolerated treatment well    Behavior During Therapy [facility] for tasks assessed/performed           Past Medical History:  Diagnosis Date  . Anxiety   . Colon polyps   . GERD (gastroesophageal reflux disease)   . PONV (postoperative nausea and vomiting)     Past Surgical History:  Procedure Laterality Date  . MASS EXCISION Right 07/20/2016   Procedure: EXCISION  OF FOREHEAD BONE MASS;  Surgeon: Wallace Going, DO;  Location: [place];  Service: Plastics;  Laterality: Right;  . PARTIAL KNEE ARTHROPLASTY Right 2010    There were no vitals filed for this visit.    Subjective Assessment - 11/05/20 1431    Subjective Pt reports she thinks her back hurts because her right knee is so painful. She needs a replacement. She does do Pure Barre 5x/week. She will get shooting pain down left side of leg, used to wake her up at night, but Prednisone has hepled this week to limit nighttime pain. She does feel like her back is a little better.    Limitations Sitting    How long can you sit comfortably? 30 minutes    How long can you stand comfortably? fine    How long can you walk comfortably? fine    Patient Stated Goals get rid of the left leg pain    Currently in Pain? Yes    Pain Score 2    10/10 at worst   Pain Location Leg    Pain Orientation Left;Lateral    Pain Descriptors /  Indicators Throbbing    Pain Type Chronic pain    Pain Radiating Towards left lateral thigh down lateral leg    Pain Onset More than a month ago    Pain Frequency Intermittent    Aggravating Factors  sleeping in L S/L, sitting    Pain Relieving Factors medicine, standing, walking              [facility] PT Assessment - 11/05/20 0001      Assessment   Medical Diagnosis Chronic Midline LBP and L-sided sciatica, Spondylolisthesis L4-5    Referring Provider (PT) Meridee Score, MD    Onset Date/Surgical Date --   8-9 mo ago   Hand Dominance Right    Next MD Visit nothing scheduled    Prior Therapy none      Precautions   Precautions None      Restrictions   Weight Bearing Restrictions No      Balance Screen   Has the patient fallen in the past 6 months No    Has the patient had a decrease in activity level because of a fear of falling?  No    Is the patient reluctant to leave their home because of a fear of falling?  No      Home Ec[employer] residence      Prior Function   Level of Independence Independent    Leisure Pure Barre 5x/week, being active      Observation/Other Assessments   Focus on Therapeutic Outcomes (FOTO)  79% ability      Posture/Postural Control   Posture Comments increased thoracic kyphosis      ROM / Strength   AROM / PROM / Strength AROM      AROM   Overall AROM Comments WFL    AROM Assessment Site Lumbar    Lumbar - Right Side Bend pulling and uncomfortable stretch to L side, QL region      Palpation   Spinal mobility 2/6 hypomobility thoracic spine    Palpation comment reproduced pain with L4-5 UPAs, increased lordosis at L4-5 consistent c spondylolisthesis                      Objective measurements completed on examination: See above findings.               PT Education - 11/05/20 1749    Education Details Diagnosis, prognosis, HEP, POC    Person(s) Educated Patient    Methods  Explanation;Demonstration;Tactile cues;Verbal cues;Handout    Comprehension Verbalized understanding;Returned demonstration            PT Short Term Goals - 11/05/20 1728      PT SHORT TERM GOAL #1   Title Pt will be independent and compliant with initial HEP.    Baseline provided at eval    Time 2    Period Weeks    Status New    Target Date 11/19/20      PT SHORT TERM GOAL #2   Title Pt will report centralization of L LE s/s to L knee and above.    Baseline down lateral leg, mainly at night    Time 3    Period Weeks    Status New    Target Date 11/26/20      PT SHORT TERM GOAL #3   Title Pt will report </= 7/10 pain at worst.    Baseline 10/10    Time 3    Period Weeks    Status New    Target Date 11/26/20             PT Long Term Goals - 11/05/20 1730      PT LONG TERM GOAL #1   Title Pt will be independent and compliant with long term HEP.    Time 6    Period Weeks    Status New    Target Date 12/17/20      PT LONG TERM GOAL #2   Title Pt will demonstrate lumbar pain free lumbar AROM.    Baseline Pulling felt in L side with R lateral flexion    Time 6    Period Weeks    Status New    Target Date 12/17/20      PT LONG TERM GOAL #3   Title Pt will have no peripheralization of pain down L LE.    Baseline down lateral leg    Time 6    Period Weeks    Status New    Target Date 12/17/20      PT LONG TERM GOAL #4   Title Pt will report </= 3/10 back pain at worst.    Baseline 10/10 into leg  Time 6    Period Weeks    Status New    Target Date 12/17/20      PT LONG TERM GOAL #5   Title Pt will report no episodes of being awoken by any low back or L LE pain.    Baseline waking up in L S/L    Time 6    Period Weeks    Status New    Target Date 12/17/20                  Plan - 11/05/20 1749    Clinical Impression Statement Pt is a 60 yo female who presents to OP PT with L4-5 Spondylolisthesis and Acute left-sided low back pain same  side sciatica. Pt's symptoms are more consistent with lumbar derangement or pinched nerve with good tolerance to all extension, but pain with sustained flexion and left lumbar opening. She demonstrates impairments in spinal mobility, flexibility, pain limiting prolonged sitting and sleeping through the night. She currently works out at [employer], but is reportedly not doing any other mobility/stretching. Pt was educated on diagnosis, prognosis, HEP, and POC and verbalizing understanding and consent to tx. She will benefit from skilled PT 1x/week for 6 weeks to address impairments.    Personal Factors and Comorbidities Past/Current Experience;Time since onset of injury/illness/exacerbation    Examination-Activity Limitations Sit;Sleep    Examination-Participation Restrictions Driving    Stability/Clinical Decision Making Stable/Uncomplicated    Clinical Decision Making Low    Rehab Potential Good    PT Frequency 1x / week    PT Duration 6 weeks    PT Treatment/Interventions ADLs/Self Care Home Management;Cryotherapy;Moist Heat;Iontophoresis 4mg /ml Dexamethasone;Electrical Stimulation;Joint Manipulations;Spinal Manipulations;Passive range of motion;Dry needling;Taping;Manual techniques;Patient/family education;Therapeutic exercise;Neuromuscular re-education;Therapeutic activities    PT Next Visit Plan Assess response to HEP and update PRN, progress spinal mobility and potentially hip flexibility, SLR and sciatic nerve tension testing for L LE    PT Home Exercise Plan [email or web address]    Consulted and Agree with Plan of Care Patient           Patient will benefit from skilled therapeutic intervention in order to improve the following deficits and impairments:  Pain,Postural dysfunction,Impaired flexibility,Increased fascial restricitons,Hypomobility  Visit Diagnosis: Acute left-sided low back pain with left-sided sciatica - Plan: PT plan of care cert/re-cert  Spondylolisthesis at L4-L5 level -  Plan: PT plan of care cert/re-cert     Problem List Patient Active Problem List   Diagnosis Date Noted  . Anxiety     Izell Sargent, PT, DPT 11/05/2020, 5:58 PM  [facility] [address] [place], [place], [address] Phone: [phone]   Fax:  [phone]  Name: Bentley Haralson MRN: [id number] Date of Birth: [birth year]

## 2020-11-05 NOTE — Patient Instructions (Signed)
Access Code: [email or web address] URL: [email or web address] Date: 11/05/2020 Prepared by: Kathreen Cornfield  Exercises Open Books - 2 x daily - 7 x weekly - 2 sets - 15 reps Standing Quadriceps Stretch - 2 x daily - 7 x weekly - 3 sets - 30 seconds hold Supine Figure 4 Piriformis Stretch - 1 x daily - 7 x weekly - 3 sets - 30 seconds hold

## 2020-11-09 ENCOUNTER — Telehealth: Payer: Self-pay

## 2020-11-09 NOTE — Telephone Encounter (Signed)
PT called and left voicemail regarding no show and attendance policy. Included date and time of patient's next appointment.  Haydee Monica, PT, DPT 11/09/20 3:05 PM

## 2020-11-20 ENCOUNTER — Ambulatory Visit: Payer: 59 | Attending: Orthopedic Surgery

## 2020-11-20 ENCOUNTER — Other Ambulatory Visit: Payer: Self-pay

## 2020-11-20 DIAGNOSIS — M4316 Spondylolisthesis, lumbar region: Secondary | ICD-10-CM | POA: Insufficient documentation

## 2020-11-20 DIAGNOSIS — M5442 Lumbago with sciatica, left side: Secondary | ICD-10-CM | POA: Insufficient documentation

## 2020-11-20 NOTE — Patient Instructions (Signed)
Access Code: [email or web address] URL: [email or web address] Date: 11/20/2020 Prepared by: Kathreen Cornfield  Exercises Open Books - 2 x daily - 7 x weekly - 2 sets - 15 reps Standing Quadriceps Stretch - 2 x daily - 7 x weekly - 3 sets - 30 seconds hold Supine Figure 4 Piriformis Stretch - 1 x daily - 7 x weekly - 3 sets - 30 seconds hold Thoracic Extension Mobilization on Foam Roll - 1-2 x daily - 7 x weekly - 1-2 sets - 10 reps Figure 4 Gluteus Mobilization on Foam Roll - 1-2 x daily - 7 x weekly - 1-3 min hold

## 2020-11-20 NOTE — Therapy (Addendum)
[place], [place], [address] Phone: [phone]   Fax:  [phone]  Physical Therapy Treatment/Discharge  Patient Details  Name: [name] MRN: [id number] Date of Birth: 1961/08/16 Referring Provider (PT): Meridee Score, MD   Encounter Date: 11/20/2020    Past Medical History:  Diagnosis Date  . Anxiety   . Colon polyps   . GERD (gastroesophageal reflux disease)   . PONV (postoperative nausea and vomiting)     Past Surgical History:  Procedure Laterality Date  . MASS EXCISION Right 07/20/2016   Procedure: EXCISION  OF FOREHEAD BONE MASS;  Surgeon: Wallace Going, DO;  Location: [place];  Service: Plastics;  Laterality: Right;  . PARTIAL KNEE ARTHROPLASTY Right 2010    There were no vitals filed for this visit.                                PT Short Term Goals - 01/01/21 1349      PT SHORT TERM GOAL #1   Title Pt will be independent and compliant with initial HEP.    Baseline provided at eval    Time 2    Period Weeks    Status Achieved    Target Date 11/19/20      PT SHORT TERM GOAL #2   Title Pt will report centralization of L LE s/s to L knee and above.    Baseline --    Time 3    Period Weeks    Status Achieved    Target Date 11/26/20      PT SHORT TERM GOAL #3   Title Pt will report </= 7/10 pain at worst.    Baseline --    Time 3    Period Weeks    Status Achieved    Target Date 11/26/20             PT Long Term Goals - 01/01/21 1349      PT LONG TERM GOAL #1   Title Pt will be independent and compliant with long term HEP.    Time 6    Period Weeks    Status Achieved      PT LONG TERM GOAL #2   Title Pt will demonstrate lumbar pain free lumbar AROM.    Time 6    Period Weeks    Status Achieved      PT LONG TERM GOAL #3   Title Pt will have no peripheralization of pain down L LE.    Time 6     Period Weeks    Status Achieved      PT LONG TERM GOAL #4   Title Pt will report </= 3/10 back pain at worst.    Time 6    Period Weeks    Status Achieved      PT LONG TERM GOAL #5   Title Pt will report no episodes of being awoken by any low back or L LE pain.    Time 6    Period Weeks    Status Achieved                  Patient will benefit from skilled therapeutic intervention in order to improve the following deficits and impairments:  Pain,Postural dysfunction,Impaired flexibility,Increased fascial restricitons,Hypomobility  Visit Diagnosis: Acute left-sided low back pain with left-sided sciatica  Spondylolisthesis at L4-L5 level  Problem List Patient Active Problem List   Diagnosis Date Noted  . Anxiety     Izell , PT, DPT 01/01/2021, 1:50 PM  [facility] [address] [place], [place], [address] Phone: [phone]   Fax:  [phone]  Name: [name] MRN: [id number] Date of Birth: 09-Feb-1961   PHYSICAL THERAPY DISCHARGE SUMMARY  Visits from Start of Care: 2  Current functional level related to goals / functional outcomes: Very good   Remaining deficits: None   Education / Equipment: HEP  Plan: Patient agrees to discharge.  Patient goals were met. Patient is being discharged due to being pleased with the current functional level.  ?????        Phill Myron. Yvette Rack, PT, DPT

## 2020-11-24 ENCOUNTER — Ambulatory Visit: Payer: 59

## 2020-12-01 ENCOUNTER — Ambulatory Visit: Payer: 59

## 2020-12-14 ENCOUNTER — Other Ambulatory Visit: Payer: Self-pay

## 2020-12-14 ENCOUNTER — Ambulatory Visit
Admission: RE | Admit: 2020-12-14 | Discharge: 2020-12-14 | Disposition: A | Discharge status: Home / Self Care | Payer: 59 | Source: Ambulatory Visit | Attending: Family Medicine | Admitting: Family Medicine

## 2020-12-14 DIAGNOSIS — Z1231 Encounter for screening mammogram for malignant neoplasm of breast: Secondary | ICD-10-CM

## 2021-10-25 ENCOUNTER — Encounter (HOSPITAL_BASED_OUTPATIENT_CLINIC_OR_DEPARTMENT_OTHER): Payer: Self-pay

## 2021-10-25 ENCOUNTER — Other Ambulatory Visit: Payer: Self-pay

## 2021-10-25 ENCOUNTER — Emergency Department (HOSPITAL_BASED_OUTPATIENT_CLINIC_OR_DEPARTMENT_OTHER)
Admission: EM | Admit: 2021-10-25 | Discharge: 2021-10-25 | Disposition: A | Discharge status: Home / Self Care | Payer: 59 | Attending: Emergency Medicine | Admitting: Emergency Medicine

## 2021-10-25 ENCOUNTER — Emergency Department (HOSPITAL_BASED_OUTPATIENT_CLINIC_OR_DEPARTMENT_OTHER): Payer: 59 | Admitting: Radiology

## 2021-10-25 DIAGNOSIS — R0789 Other chest pain: Secondary | ICD-10-CM | POA: Diagnosis not present

## 2021-10-25 DIAGNOSIS — R002 Palpitations: Secondary | ICD-10-CM | POA: Diagnosis not present

## 2021-10-25 DIAGNOSIS — R079 Chest pain, unspecified: Secondary | ICD-10-CM

## 2021-10-25 LAB — CBC
HCT: 44.2 % (ref 36.0–46.0)
Hemoglobin: 14.1 g/dL (ref 12.0–15.0)
MCH: 30.8 pg (ref 26.0–34.0)
MCHC: 31.9 g/dL (ref 30.0–36.0)
MCV: 96.5 fL (ref 80.0–100.0)
Platelets: 238 10*3/uL (ref 150–400)
RBC: 4.58 MIL/uL (ref 3.87–5.11)
RDW: 12 % (ref 11.5–15.5)
WBC: 6.9 10*3/uL (ref 4.0–10.5)
nRBC: 0 % (ref 0.0–0.2)

## 2021-10-25 LAB — BASIC METABOLIC PANEL
Anion gap: 7 (ref 5–15)
BUN: 15 mg/dL (ref 8–23)
CO2: 28 mmol/L (ref 22–32)
Calcium: 9.8 mg/dL (ref 8.9–10.3)
Chloride: 104 mmol/L (ref 98–111)
Creatinine, Ser: 0.66 mg/dL (ref 0.44–1.00)
GFR, Estimated: >60 mL/min (ref >60)
Glucose, Bld: 120 mg/dL — ABNORMAL HIGH (ref 70–99)
Potassium: 3.7 mmol/L (ref 3.5–5.1)
Sodium: 139 mmol/L (ref 135–145)

## 2021-10-25 LAB — TROPONIN I (HIGH SENSITIVITY)
Troponin I (High Sensitivity): 4 ng/L (ref <18)
Troponin I (High Sensitivity): 4 ng/L (ref <18)

## 2021-10-25 NOTE — ED Triage Notes (Signed)
Patient here POV from Home with CP.  Patient endorses CP has been present since Friday AM after Exercise. Also, associated with Subjective Tachycardia, SOB, and Mild Nausea.  No Vomiting. No Fevers.  NAD Noted during Triage. A&Ox4. GCS 15. Ambulatory.

## 2021-10-25 NOTE — Discharge Instructions (Addendum)
Follow-up with cardiology within the week.  I have sent a referral and they should call you this week.  If you do not receive a call in 3 to 4 days please call the listed number to make an appointment yourself.  Avoid strenuous activity, exercising until you are cleared by cardiology. Return immediately back to the ER if you have worsening chest pain or any additional concerns.

## 2021-10-25 NOTE — ED Provider Notes (Signed)
[facility] EMERGENCY DEPT Provider Note   CSN: [id number] Arrival date & time: 10/25/21  1522     History  Chief Complaint  Patient presents with   Chest Pain    [name] is a 61 y.o. female.  Patient presents with a chief complaint of chest discomfort and palpitations.  Symptoms ongoing on and off for the past 4 days.  4-day goes although she was at the gym exercising when she felt chest tightness and she stopped exercising and went home.  Symptoms lasted for several hours and then resolved.  She was fine at home but then intermittently the chest tightness would recur last anywhere from 30 minutes to an hour and then resolve again.  Patient states that when she was going up and down the stairs she is concerned that she seemed to develop some chest tightness as well.  However the chest tightness will come and go at other times randomly as well.  No associated fevers no cough no vomiting no diarrhea.  No diaphoresis or shortness of breath.      Home Medications Prior to Admission medications   Medication Sig Start Date End Date Taking? Authorizing Provider  ALPRAZolam Duanne Moron) 0.25 MG tablet Take 0.25 mg by mouth at bedtime as needed for anxiety. Patient not taking: Reported on 11/05/2020    [provider]  citalopram (CELEXA) 10 MG tablet Take 10 mg by mouth daily.    [provider]  Conj Estrog-Medroxyprogest Ace (PREMPRO PO) Take by mouth. Patient not taking: Reported on 11/05/2020    [provider]  esomeprazole (NEXIUM) 40 MG capsule Take 40 mg by mouth daily at 12 noon. Patient not taking: Reported on 11/05/2020    [provider]  methocarbamol (ROBAXIN) 500 MG tablet Take 1 tablet (500 mg total) by mouth every 6 (six) hours as needed for muscle spasms. Patient not taking: Reported on 11/05/2020 09/18/20   Newt Minion, MD  predniSONE (DELTASONE) 10 MG tablet TAKE 2 TABLETS BY MOUTH DAILY WITH BREAKFAST. 10/15/20    Newt Minion, MD  zolpidem (AMBIEN) 10 MG tablet Take 10 mg by mouth at bedtime as needed for sleep.    [provider]      Allergies    Cantaloupe extract allergy skin test, Watermelon flavor, Codeine, and Penicillins    Review of Systems   Review of Systems  Constitutional:  Negative for fever.  HENT:  Negative for ear pain.   Eyes:  Negative for pain.  Respiratory:  Negative for cough.   Cardiovascular:  Positive for chest pain.  Gastrointestinal:  Negative for abdominal pain.  Genitourinary:  Negative for flank pain.  Musculoskeletal:  Negative for back pain.  Skin:  Negative for rash.  Neurological:  Negative for headaches.   Physical Exam Updated Vital Signs BP 133/80    Pulse 66    Temp 98.6 F (37 C) (Oral)    Resp 15    Ht 5\' 6"  (1.676 m)    Wt 78.5 kg    SpO2 98%    BMI 27.93 kg/m  Physical Exam Constitutional:      General: She is not in acute distress.    Appearance: Normal appearance.  HENT:     Head: Normocephalic.     Nose: Nose normal.  Eyes:     Extraocular Movements: Extraocular movements intact.  Cardiovascular:     Rate and Rhythm: Normal rate.  Pulmonary:     Effort: Pulmonary effort is normal.  Chest:     Comments: Left mid chest wall tenderness on palpation.  Patient states this reproduces her chest discomfort. Musculoskeletal:        General: Normal range of motion.     Cervical back: Normal range of motion.     Right lower leg: No tenderness. No edema.     Left lower leg: No tenderness. No edema.  Neurological:     General: No focal deficit present.     Mental Status: She is alert. Mental status is at baseline.    ED Results / Procedures / Treatments   Labs (all labs ordered are listed, but only abnormal results are displayed) Labs Reviewed  BASIC METABOLIC PANEL - Abnormal; Notable for the following components:      Result Value   Glucose, Bld 120 (*)    All other components within normal limits  CBC  TROPONIN I (HIGH  SENSITIVITY)  TROPONIN I (HIGH SENSITIVITY)    EKG EKG Interpretation  Date/Time:  Monday October 25 2021 15:29:33 EST Ventricular Rate:  76 PR Interval:  130 QRS Duration: 90 QT Interval:  369 QTC Calculation: 415 R Axis:   41 Text Interpretation: Sinus rhythm Confirmed by Thamas Jaegers ([id number]) on 10/25/2021 3:38:16 PM  Radiology DG Chest 2 View  Result Date: 10/25/2021 CLINICAL DATA:  Chest and jaw pain for the last 3 days. EXAM: CHEST - 2 VIEW COMPARISON:  12/24/2013 in the CT of 12/26/2013. FINDINGS: Lateral view degraded by patient arm position. Midline trachea.  Normal heart size and mediastinal contours. Sharp costophrenic angles. No pneumothorax. Calcified granuloma in the inferior left upper lobe again identified. IMPRESSION: No active cardiopulmonary disease. Electronically Signed   By: Abigail Miyamoto M.D.   On: 10/25/2021 16:01    Procedures Procedures    Medications Ordered in ED Medications - No data to display  ED Course/ Medical Decision Making/ A&P                           Medical Decision Making 61 year old female presenting with intermittent chest pain for 4 days.  No significant past medical history no cardiac prior history.  Possible exertional component, also appears to be reproduced with palpation of the chest wall.  EKG unremarkable, troponin negative.  Patient denies any smoking history.  She does have a family history of father had MI at age 56.  Amount and/or Complexity of Data Reviewed Independent Historian: spouse Labs: ordered. Decision-making details documented in ED Course. Radiology: ordered. Decision-making details documented in ED Course. ECG/medicine tests: ordered and independent interpretation performed. Decision-making details documented in ED Course.  Risk Decision regarding hospitalization.   Troponins are negative x2.  Patient symptoms have improved and blood pressure spontaneously improved.  Recommend outpatient follow-up with  cardiology within the week.  Recommending avoidance of any exercising or strenuous activity until cleared by cardiology.  Recommend immediate return for worsening symptoms or any additional concerns to come right back to the ER.        Final Clinical Impression(s) / ED Diagnoses Final diagnoses:  Chest pain, unspecified type    Rx / DC Orders ED Discharge Orders          Ordered    Ambulatory referral to Cardiology        10/25/21 1956              Luna Fuse, MD 10/25/21 [phone]

## 2021-10-25 NOTE — ED Notes (Signed)
Pt verbalizes understanding of discharge instructions. Opportunity for questioning and answers were provided. Pt discharged from ED to home with s/o.

## 2021-10-25 NOTE — ED Notes (Signed)
Patient transported to X-ray 

## 2021-11-11 ENCOUNTER — Ambulatory Visit (HOSPITAL_BASED_OUTPATIENT_CLINIC_OR_DEPARTMENT_OTHER): Payer: 59 | Admitting: Cardiology

## 2021-11-11 ENCOUNTER — Other Ambulatory Visit: Payer: Self-pay

## 2021-11-11 ENCOUNTER — Encounter (HOSPITAL_BASED_OUTPATIENT_CLINIC_OR_DEPARTMENT_OTHER): Payer: Self-pay | Admitting: Cardiology

## 2021-11-11 VITALS — BP 132/94 | HR 65 | Ht 66.0 in | Wt 161.9 lb

## 2021-11-11 DIAGNOSIS — E7800 Pure hypercholesterolemia, unspecified: Secondary | ICD-10-CM | POA: Diagnosis not present

## 2021-11-11 DIAGNOSIS — Z8249 Family history of ischemic heart disease and other diseases of the circulatory system: Secondary | ICD-10-CM | POA: Diagnosis not present

## 2021-11-11 DIAGNOSIS — R072 Precordial pain: Secondary | ICD-10-CM

## 2021-11-11 DIAGNOSIS — Z01818 Encounter for other preprocedural examination: Secondary | ICD-10-CM

## 2021-11-11 DIAGNOSIS — Z01812 Encounter for preprocedural laboratory examination: Secondary | ICD-10-CM

## 2021-11-11 DIAGNOSIS — R079 Chest pain, unspecified: Secondary | ICD-10-CM | POA: Diagnosis not present

## 2021-11-11 DIAGNOSIS — Z7189 Other specified counseling: Secondary | ICD-10-CM

## 2021-11-11 DIAGNOSIS — R030 Elevated blood-pressure reading, without diagnosis of hypertension: Secondary | ICD-10-CM

## 2021-11-11 LAB — LIPID PANEL
Chol/HDL Ratio: 2.5 ratio (ref 0.0–4.4)
Cholesterol, Total: 239 mg/dL — ABNORMAL HIGH (ref 100–199)
HDL: 97 mg/dL (ref >39)
LDL Chol Calc (NIH): 129 mg/dL — ABNORMAL HIGH (ref 0–99)
Triglycerides: 79 mg/dL (ref 0–149)
VLDL Cholesterol Cal: 13 mg/dL (ref 5–40)

## 2021-11-11 LAB — BASIC METABOLIC PANEL
BUN/Creatinine Ratio: 14 (ref 12–28)
BUN: 12 mg/dL (ref 8–27)
CO2: 26 mmol/L (ref 20–29)
Calcium: 9.5 mg/dL (ref 8.7–10.3)
Chloride: 102 mmol/L (ref 96–106)
Creatinine, Ser: 0.84 mg/dL (ref 0.57–1.00)
Glucose: 103 mg/dL — ABNORMAL HIGH (ref 70–99)
Potassium: 4.8 mmol/L (ref 3.5–5.2)
Sodium: 141 mmol/L (ref 134–144)
eGFR: 79 mL/min/{1.73_m2} (ref >59)

## 2021-11-11 MED ORDER — METOPROLOL TARTRATE 25 MG PO TABS: ORAL_TABLET | ORAL | 0 refills | Status: DC

## 2021-11-11 NOTE — Patient Instructions (Addendum)
Medication Instructions:  Your Physician recommend you continue on your current medication as directed.    *If you need a refill on your cardiac medications before your next appointment, please call your pharmacy*   Lab Work: Your provider has recommended lab work today (BMP, Lipid). Please have this collected at [facility] at [place]. The lab is open 8:00 am - 4:30 pm. Please avoid 12:00p - 1:00p for lunch hour. You do not need an appointment. Please go to [address] [place], [place] [address]. This is in the Primary Care office on the 3rd floor, let them know you are there for blood work and they will direct you to the lab.  If you have labs (blood work) drawn today and your tests are completely normal, you will receive your results only by: [place] (if you have MyChart) OR A paper copy in the mail If you have any lab test that is abnormal or we need to change your treatment, we will call you to review the results.   Testing/Procedures: Cardiac CT Angiography (CTA), is a special type of CT scan that uses a computer to produce multi-dimensional views of major blood vessels throughout the body. In CT angiography, a contrast material is injected through an IV to help visualize the blood vessels  [facility]  Follow-Up: At [facility], you and your health needs are our priority.  As part of our continuing mission to provide you with exceptional heart care, we have created designated Provider Care Teams.  These Care Teams include your primary Cardiologist (physician) and Advanced Practice Providers (APPs -  Physician Assistants and Nurse Practitioners) who all work together to provide you with the care you need, when you need it.  We recommend signing up for the patient portal called "MyChart".  Sign up information is provided on this After Visit Summary.  MyChart is used to connect with patients for Virtual Visits (Telemedicine).  Patients are  able to view lab/test results, encounter notes, upcoming appointments, etc.  Non-urgent messages can be sent to your provider as well.   To learn more about what you can do with MyChart, go to [email or web address].    Your next appointment:   Based on test results  The format for your next appointment:   In Person  Provider:   Buford Dresser, MD     Your cardiac CT will be scheduled at one of the below locations:   [facility] [address] [place], [place] [address] [phone]   If scheduled at [facility], please arrive at the [facility] main entrance (entrance A) of [facility] 30 minutes prior to test start time. You can use the FREE valet parking offered at the main entrance (encouraged to control the heart rate for the test) Proceed to the [facility] Radiology Department (first floor) to check-in and test prep.  If scheduled at [facility], please arrive 15 mins early for check-in and test prep.  Please follow these instructions carefully (unless otherwise directed):   On the Night Before the Test: Be sure to Drink plenty of water. Do not consume any caffeinated/decaffeinated beverages or chocolate 12 hours prior to your test. Do not take any antihistamines 12 hours prior to your test.  On the Day of the Test: Drink plenty of water until 1 hour prior to the test. Do not eat any food 4 hours prior to the test. You may take your regular medications prior  to the test.  Take metoprolol (Lopressor) 25 mg two hours prior to test. FEMALES- please wear underwire-free bra if available, avoid dresses & tight clothing        After the Test: Drink plenty of water. After receiving IV contrast, you may experience a mild flushed feeling. This is normal. On occasion, you may experience a mild rash up to 24 hours after the test. This is not dangerous. If this occurs, you can take Benadryl 25 mg and increase  your fluid intake. If you experience trouble breathing, this can be serious. If it is severe call 911 IMMEDIATELY. If it is mild, please call our office. If you take any of these medications: Glipizide/Metformin, Avandament, Glucavance, please do not take 48 hours after completing test unless otherwise instructed.  We will call to schedule your test 2-4 weeks out understanding that some insurance companies will need an authorization prior to the service being performed.   For non-scheduling related questions, please contact the cardiac imaging nurse navigator should you have any questions/concerns: Marchia Bond, Cardiac Imaging Nurse Navigator Gordy Clement, Cardiac Imaging Nurse Navigator [place] Direct Office Dial: [phone]   For scheduling needs, including cancellations and rescheduling, please call [place], [phone].

## 2021-11-11 NOTE — Progress Notes (Signed)
Cardiology Office Note:    Date:  11/11/2021   ID:  Norma, Lewis 21-May-1961, MRN [id number]  PCP:  Kelton Pillar, MD  Cardiologist:  Buford Dresser, MD  Referring MD: Luna Fuse, MD   CC: new patient consultation for chest pain  History of Present Illness:    [name] is a 61 y.o. female with family history of heart disease who is seen as a new consult at the request of [place], Greggory Brandy, MD for the evaluation and management of chest pain.  ER note from 10/25/21 reviewed. Noted to have 4 day history of chest pain and palpitations. Concern for exertional component. Noted to have left chest wall tenderness on exam that reproduced her pain. ECG and hsTn unremarkable.  Today: Has a lot of concern re: being the oldest in her family, as everyone else has died at a younger age. Previously seen by Dr. Martinique distantly, told her heart was ok (I cannot see records).   She has had a lot of anxiety about her heart. Has noticed neck pain, elevated heart rate, jaw pain, and chest pain. She was so concerned that she drove herself to the ER for evaluation.  -Prior cardiac history: never told she has a cardiac diagnosis -Prior workup: had ecg and treadmill stress test in the remote past, told it was normal -Prior treatment: none -Alcohol: typically not during the week, will have a few glasses on the weekend. -Tobacco: quit 12 years ago -Cardiac ROS: no shortness of breath, no PND, no orthopnea, no LE edema, no syncope -Family history:  father died age 68, was alcoholic, smoker and diabetic, told he had a silent MI. Mother died of colon cancer age 82. Brother died age 20 of pancreatic cancer, was an alcoholic but sober for 15 years before he died.  Does [employer] exercise nearly every day.  Past Medical History:  Diagnosis Date   Anxiety    Colon polyps    GERD (gastroesophageal reflux disease)    PONV (postoperative nausea and vomiting)     Past Surgical  History:  Procedure Laterality Date   MASS EXCISION Right 07/20/2016   Procedure: EXCISION  OF FOREHEAD BONE MASS;  Surgeon: Wallace Going, DO;  Location: [place];  Service: Plastics;  Laterality: Right;   PARTIAL KNEE ARTHROPLASTY Right 2010    Current Medications: Current Outpatient Medications on File Prior to Visit  Medication Sig   ALPRAZolam (XANAX) 0.25 MG tablet Take 0.25 mg by mouth at bedtime as needed for anxiety.   Ascorbic Acid (VITAMIN C) 500 MG CAPS Take 1 tablet by mouth daily.   Cholecalciferol 25 MCG (1000 UT) tablet Take 1 tablet by mouth daily.   citalopram (CELEXA) 10 MG tablet Take 10 mg by mouth daily.   Omega-3 Fatty Acids (FISH OIL) 1000 MG CAPS Take 1 capsule by mouth daily.   Turmeric (QC TUMERIC COMPLEX PO) Take 1 tablet by mouth daily at 12 noon.   Zinc 100 MG TABS Take 1 tablet by mouth daily.   zolpidem (AMBIEN) 10 MG tablet Take 10 mg by mouth at bedtime as needed for sleep.   No current facility-administered medications on file prior to visit.     Allergies:   Cantaloupe extract allergy skin test, Watermelon flavor, Codeine, and Penicillins   Social History   Tobacco Use   Smoking status: Former    Types: Cigarettes    Quit date: 01/23/2010    Years since quitting: 57.8  Smokeless tobacco: Never  Substance Use Topics   Alcohol use: No   Drug use: No    Family History: father died age 33, was alcoholic, smoker and diabetic, told he had a silent MI. Mother died of colon cancer age 39. Brother died age 11 of pancreatic cancer, was an alcoholic but sober for 15 years before he died.  ROS:   Please see the history of present illness.  Additional pertinent ROS: Constitutional: Negative for chills, fever, night sweats, unintentional weight loss  HENT: Negative for ear pain and hearing loss.   Eyes: Negative for loss of vision and eye pain.  Respiratory: Negative for cough, sputum, wheezing.   Cardiovascular: See  HPI. Gastrointestinal: Negative for abdominal pain, melena, and hematochezia.  Genitourinary: Negative for dysuria and hematuria.  Musculoskeletal: Negative for falls and myalgias.  Skin: Negative for itching and rash.  Neurological: Negative for focal weakness, focal sensory changes and loss of consciousness.  Endo/Heme/Allergies: Does not bruise/bleed easily.     EKGs/Labs/Other Studies Reviewed:    The following studies were reviewed today: None  EKG:  EKG is personally reviewed.   11/11/21: NSR 65 bpm  Recent Labs: 10/25/2021: BUN 15; Creatinine, Ser 0.66; Hemoglobin 14.1; Platelets 238; Potassium 3.7; Sodium 139  Recent Lipid Panel No results found for: CHOL, TRIG, HDL, CHOLHDL, VLDL, LDLCALC, LDLDIRECT  Physical Exam:    VS:  BP (!) 132/94    Pulse 65    Ht 5\' 6"  (1.676 m)    Wt 161 lb 14.4 oz (73.4 kg)    BMI 26.13 kg/m     Wt Readings from Last 3 Encounters:  11/11/21 161 lb 14.4 oz (73.4 kg)  10/25/21 173 lb 1 oz (78.5 kg)  07/20/16 173 lb (78.5 kg)    GEN: Well nourished, well developed in no acute distress HEENT: Normal, moist mucous membranes NECK: No JVD CARDIAC: regular rhythm, normal S1 and S2, no rubs or gallops. No murmur. VASCULAR: Radial and DP pulses 2+ bilaterally. No carotid bruits RESPIRATORY:  Clear to auscultation without rales, wheezing or rhonchi  ABDOMEN: Soft, non-tender, non-distended MUSCULOSKELETAL:  Ambulates independently SKIN: Warm and dry, no edema NEUROLOGIC:  Alert and oriented x 3. No focal neuro deficits noted. PSYCHIATRIC:  Normal affect    ASSESSMENT:    1. Chest pain of uncertain etiology   2. Family history of heart disease   3. Pure hypercholesterolemia   4. Cardiac risk counseling   5. Counseling on health promotion and disease prevention   6. Elevated blood pressure reading   7. Precordial pain   8. Pre-procedure lab exam    PLAN:    Chest pain Family history of heart disease -discussed treadmill stress, nuclear  stress/lexiscan, and CT coronary angiography. Discussed pros and cons of each, including but not limited to false positive/false negative risk, radiation risk, and risk of IV contrast dye. Based on shared decision making, decision was made to pursue CT coronary angiography. -will give one time dose of metoprolol 2 hours prior to scheduled test -counseled on need to get BMET prior to test (last done 1.16.23) -counseled on use of sublingual nitroglycerin and its importance to a good test   Hypercholesterolemia -per [facility] 05/20/21: Tchol 250, HDL 101, LDL 139, TG 61 -recheck lipids today  Elevated blood pressure: on no meds, stressed today -continue to monitor, typically normal -given diastolic elevation, would consider amlodipine or ARB if remains elevated  Cardiac risk counseling and prevention recommendations: -recommend heart healthy/Mediterranean diet, with whole grains,  fruits, vegetable, fish, lean meats, nuts, and olive oil. Limit salt. -recommend moderate walking, 3-5 times/week for 30-50 minutes each session. Aim for at least 150 minutes.week. Goal should be pace of 3 miles/hours, or walking 1.5 miles in 30 minutes -recommend avoidance of tobacco products. Avoid excess alcohol. -ASCVD risk score: The ASCVD Risk score (Arnett DK, et al., 2019) failed to calculate for the following reasons:   Cannot find a previous HDL lab   Cannot find a previous total cholesterol lab    Plan for follow up: to be determined based on results of testing  Buford Dresser, MD, PhD, [facility]    Medication Adjustments/Labs and Tests Ordered: Current medicines are reviewed at length with the patient today.  Concerns regarding medicines are outlined above.  Orders Placed This Encounter  Procedures   CT CORONARY MORPH W/CTA COR W/SCORE W/CA W/CM &/OR WO/CM   Lipid panel   Basic metabolic panel   EKG 12-WPYK   Meds ordered this encounter  Medications   metoprolol tartrate  (LOPRESSOR) 25 MG tablet    Sig: TAKE 1 TABLET 2 HR PRIOR TO CARDIAC PROCEDURE    Dispense:  1 tablet    Refill:  0    Patient Instructions  Medication Instructions:  Your Physician recommend you continue on your current medication as directed.    *If you need a refill on your cardiac medications before your next appointment, please call your pharmacy*   Lab Work: Your provider has recommended lab work today (BMP, Lipid). Please have this collected at [facility] at [place]. The lab is open 8:00 am - 4:30 pm. Please avoid 12:00p - 1:00p for lunch hour. You do not need an appointment. Please go to [address] [place], [place] [address]. This is in the Primary Care office on the 3rd floor, let them know you are there for blood work and they will direct you to the lab.  If you have labs (blood work) drawn today and your tests are completely normal, you will receive your results only by: [place] (if you have MyChart) OR A paper copy in the mail If you have any lab test that is abnormal or we need to change your treatment, we will call you to review the results.   Testing/Procedures: Cardiac CT Angiography (CTA), is a special type of CT scan that uses a computer to produce multi-dimensional views of major blood vessels throughout the body. In CT angiography, a contrast material is injected through an IV to help visualize the blood vessels  [facility]  Follow-Up: At [facility], you and your health needs are our priority.  As part of our continuing mission to provide you with exceptional heart care, we have created designated Provider Care Teams.  These Care Teams include your primary Cardiologist (physician) and Advanced Practice Providers (APPs -  Physician Assistants and Nurse Practitioners) who all work together to provide you with the care you need, when you need it.  We recommend signing up for the patient portal called "MyChart".  Sign up  information is provided on this After Visit Summary.  MyChart is used to connect with patients for Virtual Visits (Telemedicine).  Patients are able to view lab/test results, encounter notes, upcoming appointments, etc.  Non-urgent messages can be sent to your provider as well.   To learn more about what you can do with MyChart, go to [email or web address].    Your next appointment:   Based  on test results  The format for your next appointment:   In Person  Provider:   Buford Dresser, MD     Your cardiac CT will be scheduled at one of the below locations:   [facility] [address] [place], [place] [address] [phone]   If scheduled at [facility], please arrive at the [facility] main entrance (entrance A) of [facility] 30 minutes prior to test start time. You can use the FREE valet parking offered at the main entrance (encouraged to control the heart rate for the test) Proceed to the [facility] Radiology Department (first floor) to check-in and test prep.  If scheduled at [facility], please arrive 15 mins early for check-in and test prep.  Please follow these instructions carefully (unless otherwise directed):   On the Night Before the Test: Be sure to Drink plenty of water. Do not consume any caffeinated/decaffeinated beverages or chocolate 12 hours prior to your test. Do not take any antihistamines 12 hours prior to your test.  On the Day of the Test: Drink plenty of water until 1 hour prior to the test. Do not eat any food 4 hours prior to the test. You may take your regular medications prior to the test.  Take metoprolol (Lopressor) 25 mg two hours prior to test. FEMALES- please wear underwire-free bra if available, avoid dresses & tight clothing        After the Test: Drink plenty of water. After receiving IV contrast, you may experience a mild flushed feeling. This is normal. On occasion,  you may experience a mild rash up to 24 hours after the test. This is not dangerous. If this occurs, you can take Benadryl 25 mg and increase your fluid intake. If you experience trouble breathing, this can be serious. If it is severe call 911 IMMEDIATELY. If it is mild, please call our office. If you take any of these medications: Glipizide/Metformin, Avandament, Glucavance, please do not take 48 hours after completing test unless otherwise instructed.  We will call to schedule your test 2-4 weeks out understanding that some insurance companies will need an authorization prior to the service being performed.   For non-scheduling related questions, please contact the cardiac imaging nurse navigator should you have any questions/concerns: Marchia Bond, Cardiac Imaging Nurse Navigator Gordy Clement, Cardiac Imaging Nurse Navigator [place] [facility] Direct Office Dial: [phone]   For scheduling needs, including cancellations and rescheduling, please call [place], [phone].   Signed, Buford Dresser, MD PhD 11/11/2021 7:10 PM    [place]

## 2021-11-19 ENCOUNTER — Telehealth (HOSPITAL_COMMUNITY): Payer: Self-pay | Admitting: *Deleted

## 2021-11-19 NOTE — Telephone Encounter (Signed)
Attempted to call patient regarding upcoming cardiac CT appointment. °Left message on voicemail with name and callback number ° °Victor Granados RN Navigator Cardiac Imaging °[facility] [facility] °[phone] Office °[phone] Cell ° °

## 2021-11-22 ENCOUNTER — Telehealth (HOSPITAL_COMMUNITY): Payer: Self-pay | Admitting: *Deleted

## 2021-11-22 NOTE — Telephone Encounter (Signed)
Reaching out to patient to offer assistance regarding upcoming cardiac imaging study; pt verbalizes understanding of appt date/time, parking situation and where to check in, pre-test NPO status and medications ordered, and verified current allergies; name and call back number provided for further questions should they arise  Gordy Clement RN Navigator Cardiac Imaging [facility] [phone] office [phone] cell  Patient to take 25mg  metoprolol tartrate two hours prior to her cardiac CT scan. She is aware to arrive at 8:30am for her 9am scan.

## 2021-11-23 ENCOUNTER — Other Ambulatory Visit: Payer: Self-pay

## 2021-11-23 ENCOUNTER — Ambulatory Visit (HOSPITAL_COMMUNITY)
Admission: RE | Admit: 2021-11-23 | Discharge: 2021-11-23 | Disposition: A | Discharge status: Home / Self Care | Payer: 59 | Source: Ambulatory Visit | Attending: Cardiology | Admitting: Cardiology

## 2021-11-23 ENCOUNTER — Encounter (HOSPITAL_BASED_OUTPATIENT_CLINIC_OR_DEPARTMENT_OTHER): Payer: Self-pay

## 2021-11-23 DIAGNOSIS — R072 Precordial pain: Secondary | ICD-10-CM

## 2021-11-23 DIAGNOSIS — R079 Chest pain, unspecified: Secondary | ICD-10-CM | POA: Diagnosis not present

## 2021-11-23 MED ORDER — IOHEXOL 350 MG/ML SOLN
100.0000 mL | Freq: Once | INTRAVENOUS | Status: AC | PRN
  Administered 2021-11-23: 100 mL via INTRAVENOUS

## 2021-11-23 MED ORDER — NITROGLYCERIN 0.4 MG SL SUBL
SUBLINGUAL_TABLET | SUBLINGUAL | Status: AC
  Filled 2021-11-23: qty 2

## 2021-11-23 MED ORDER — NITROGLYCERIN 0.4 MG SL SUBL
0.8000 mg | SUBLINGUAL_TABLET | Freq: Once | SUBLINGUAL | Status: AC
  Administered 2021-11-23: 0.8 mg via SUBLINGUAL

## 2021-11-23 NOTE — Telephone Encounter (Signed)
Hey there, Do you have a time that you want to squeeze her in to speak about medication? Also does she need to reach out to Dr. Laurann Montana or are we doing that?

## 2021-11-24 ENCOUNTER — Encounter (HOSPITAL_BASED_OUTPATIENT_CLINIC_OR_DEPARTMENT_OTHER): Payer: Self-pay | Admitting: Cardiology

## 2021-11-24 ENCOUNTER — Other Ambulatory Visit: Payer: Self-pay | Admitting: *Deleted

## 2021-11-24 ENCOUNTER — Ambulatory Visit (HOSPITAL_BASED_OUTPATIENT_CLINIC_OR_DEPARTMENT_OTHER): Payer: 59 | Admitting: Cardiology

## 2021-11-24 VITALS — BP 126/84 | HR 74 | Ht 66.0 in | Wt 162.0 lb

## 2021-11-24 DIAGNOSIS — Z7189 Other specified counseling: Secondary | ICD-10-CM | POA: Diagnosis not present

## 2021-11-24 DIAGNOSIS — Z8249 Family history of ischemic heart disease and other diseases of the circulatory system: Secondary | ICD-10-CM

## 2021-11-24 DIAGNOSIS — E7800 Pure hypercholesterolemia, unspecified: Secondary | ICD-10-CM

## 2021-11-24 DIAGNOSIS — Z79899 Other long term (current) drug therapy: Secondary | ICD-10-CM

## 2021-11-24 DIAGNOSIS — I2510 Atherosclerotic heart disease of native coronary artery without angina pectoris: Secondary | ICD-10-CM

## 2021-11-24 DIAGNOSIS — J9859 Other diseases of mediastinum, not elsewhere classified: Secondary | ICD-10-CM | POA: Insufficient documentation

## 2021-11-24 MED ORDER — ROSUVASTATIN CALCIUM 10 MG PO TABS: 10.0000 mg | ORAL_TABLET | Freq: Every day | ORAL | 3 refills | Status: DC

## 2021-11-24 MED ORDER — ASPIRIN EC 81 MG PO TBEC: 81.0000 mg | DELAYED_RELEASE_TABLET | Freq: Every day | ORAL | 3 refills | Status: DC

## 2021-11-24 NOTE — Progress Notes (Signed)
Cardiology Office Note:    Date:  11/24/2021   ID:  Verl Dicker, DOB [birth year], MRN [id number]  PCP:  Kelton Pillar, MD  Cardiologist:  Buford Dresser, MD  Referring MD: Kelton Pillar, MD   CC: follow up to discuss test results  History of Present Illness:    [name] is a 61 y.o. female with family history of heart disease who is seen today for follow-up. She was initially seen 11/11/21 as a new consult at the request of Kelton Pillar, MD for the evaluation and management of chest pain.  Today: She is doing well with no major concerns. We discussed the results of her coronary CT performed 11/23/21 and the potential use of a statin and aspirin. She is open to trying a statin and aspirin.  For diet, she does not eat red meat. Her parents both passed away whien she was young so she is unsure of their medical history.   Of note, Dr. Roxan Hockey scheduled her a PET scan on the 27th at her request because of an enlarged lymph node found on her chest CT.   Denies chest pain, shortness of breath at rest or with normal exertion. No PND, orthopnea, LE edema or unexpected weight gain. No syncope or palpitations.  Past Medical History:  Diagnosis Date   Anxiety    Colon polyps    GERD (gastroesophageal reflux disease)    PONV (postoperative nausea and vomiting)     Past Surgical History:  Procedure Laterality Date   MASS EXCISION Right 07/20/2016   Procedure: EXCISION  OF FOREHEAD BONE MASS;  Surgeon: Wallace Going, DO;  Location: [facility];  Service: Plastics;  Laterality: Right;   PARTIAL KNEE ARTHROPLASTY Right 2010    Current Medications: Current Outpatient Medications on File Prior to Visit  Medication Sig   ALPRAZolam (XANAX) 0.25 MG tablet Take 0.25 mg by mouth at bedtime as needed for anxiety.   Ascorbic Acid (VITAMIN C) 500 MG CAPS Take 1 tablet by mouth daily.   Cholecalciferol 25 MCG (1000 UT) tablet Take 1 tablet by  mouth daily.   citalopram (CELEXA) 10 MG tablet Take 10 mg by mouth daily.   metoprolol tartrate (LOPRESSOR) 25 MG tablet TAKE 1 TABLET 2 HR PRIOR TO CARDIAC PROCEDURE   Omega-3 Fatty Acids (FISH OIL) 1000 MG CAPS Take 1 capsule by mouth daily.   Turmeric (QC TUMERIC COMPLEX PO) Take 1 tablet by mouth daily at 12 noon.   Zinc 100 MG TABS Take 1 tablet by mouth daily.   zolpidem (AMBIEN) 10 MG tablet Take 10 mg by mouth at bedtime as needed for sleep.   No current facility-administered medications on file prior to visit.     Allergies:   Cantaloupe extract allergy skin test, Watermelon flavor, Codeine, and Penicillins   Social History   Tobacco Use   Smoking status: Former    Types: Cigarettes    Quit date: 01/23/2010    Years since quitting: 11.8   Smokeless tobacco: Never  Substance Use Topics   Alcohol use: No   Drug use: No    Family History: father died age 6, was alcoholic, smoker and diabetic, told he had a silent MI. Mother died of colon cancer age 56. Brother died age 23 of pancreatic cancer, was an alcoholic but sober for 15 years before he died.  ROS:   Please see the history of present illness.  Additional pertinent ROS:  EKGs/Labs/Other Studies Reviewed:  The following studies were reviewed today: CT Coronary Morph 11/23/21 1. Coronary calcium score of 2.28. This was 61st percentile for age-, race-, and sex-matched controls. 2. Normal coronary origin with right dominance. 3. Minimal (<25%) calcified plaque in the proximal LAD. CAD-RADS 1. 4.  Recommend aggressive risk factor modification. 5.  Consider non-ischemic causes of chest pain Soft tissue mass in the visualized anterior mediastinum just to the right of midline and anterior to the ascending thoracic aorta with estimated dimensions of approximately 2.3 x 1.6 x 2.4 cm. This statistically likely represents an enlarged lymph node. Initial evaluation is recommended with a CT of the entire chest with  IV contrast. Correlation also suggested with any palpable lymphadenopathy in other areas of the body by physical examination which may lead to a need to image additional parts of the body.   EKG:  EKG was personally reviewed today 11/24/21: not ordered today 11/11/21: NSR 65 bpm  Recent Labs: 10/25/2021: Hemoglobin 14.1; Platelets 238 11/11/2021: BUN 12; Creatinine, Ser 0.84; Potassium 4.8; Sodium 141  Recent Lipid Panel    Component Value Date/Time   CHOL 239 (H) 11/11/2021 1059   TRIG 79 11/11/2021 1059   HDL 97 11/11/2021 1059   CHOLHDL 2.5 11/11/2021 1059   LDLCALC 129 (H) 11/11/2021 1059    Physical Exam:    VS:  BP 126/84    Pulse 74    Ht 5\' 6"  (1.676 m)    Wt 162 lb (73.5 kg)    SpO2 99%    BMI 26.15 kg/m     Wt Readings from Last 3 Encounters:  11/24/21 162 lb (73.5 kg)  11/11/21 161 lb 14.4 oz (73.4 kg)  10/25/21 173 lb 1 oz (78.5 kg)    GEN: Well nourished, well developed in no acute distress HEENT: Normal, moist mucous membranes NECK: No JVD CARDIAC: regular rhythm, normal S1 and S2, no rubs or gallops. No murmur. VASCULAR: Radial and DP pulses 2+ bilaterally. No carotid bruits RESPIRATORY:  Clear to auscultation without rales, wheezing or rhonchi  ABDOMEN: Soft, non-tender, non-distended MUSCULOSKELETAL:  Ambulates independently SKIN: Warm and dry, no edema NEUROLOGIC:  Alert and oriented x 3. No focal neuro deficits noted. PSYCHIATRIC:  Normal affect    ASSESSMENT:    1. Nonobstructive atherosclerosis of coronary artery   2. Family history of heart disease   3. Pure hypercholesterolemia   4. Cardiac risk counseling   5. Counseling on health promotion and disease prevention   6. Medication management     PLAN:    Chest pain: noncardiac based on CT  Nonobstructive coronary disease based on CT Extensive review of test results Family history of heart disease Hypercholesterolemia -discussed pathology of cholesterol, how plaques form, that MI/CVA result  commonly from acute plaque rupture and not gradual stenosis. Discussed mechanism of statin to both decrease plaque accumulation and stabilize plaque that is already present. Discussed that calcium is a marker for plaque, with decades of validated data regarding average amounts of calcium for age/gender/ethnicity, as well as value of calcium score for risk stratification  -extensively reviewed images of her CT/discussed recommendations today -she is personally friends with Dr. Roxan Hockey and discussed her CT finding in the mediastinum. She has been referred to Dr. Roxan Hockey for this, who has ordered a PET scan for further evaluation -we discussed the data on statins, both in terms of their long term benefit as well as the risk of side effects. Reviewed common misconceptions about statins. Reviewed how we monitor treatment. After  shared decision making, patient is agreeable to trialing statin.  -we also discussed aspirin, she is amenable -recheck lipids/LFTs in 3 mos, goal LDL <70  Elevated blood pressure: improved today, no indication to start treatment  Cardiac risk counseling and prevention recommendations: -recommend heart healthy/Mediterranean diet, with whole grains, fruits, vegetable, fish, lean meats, nuts, and olive oil. Limit salt. -recommend moderate walking, 3-5 times/week for 30-50 minutes each session. Aim for at least 150 minutes.week. Goal should be pace of 3 miles/hours, or walking 1.5 miles in 30 minutes -recommend avoidance of tobacco products. Avoid excess alcohol.  Plan for follow up: 1 year  Buford Dresser, MD, PhD, [facility]    Medication Adjustments/Labs and Tests Ordered: Current medicines are reviewed at length with the patient today.  Concerns regarding medicines are outlined above.  Orders Placed This Encounter  Procedures   Lipid panel   Hepatic function panel   Meds ordered this encounter  Medications   aspirin EC 81 MG tablet     Sig: Take 1 tablet (81 mg total) by mouth daily. Swallow whole.    Dispense:  90 tablet    Refill:  3   rosuvastatin (CRESTOR) 10 MG tablet    Sig: Take 1 tablet (10 mg total) by mouth daily.    Dispense:  90 tablet    Refill:  3    Patient Instructions  Medication Instructions:  1) START: Aspirin 81 mg daily 2) START: Rosuvastatin 10 mg daily   *If you need a refill on your cardiac medications before your next appointment, please call your pharmacy*   Lab Work: Your provider has recommended lab work May, 2023 (Fasting Lipid/ LFT). Please have this collected at [facility] at [place]. The lab is open 8:00 am - 4:30 pm. Please avoid 12:00p - 1:00p for lunch hour. You do not need an appointment. Please go to [address] [place], [place] [address]. This is in the Primary Care office on the 3rd floor, let them know you are there for blood work and they will direct you to the lab.  If you have labs (blood work) drawn today and your tests are completely normal, you will receive your results only by: [place] (if you have MyChart) OR A paper copy in the mail If you have any lab test that is abnormal or we need to change your treatment, we will call you to review the results.   Testing/Procedures: None ordered today   Follow-Up: At [facility], you and your health needs are our priority.  As part of our continuing mission to provide you with exceptional heart care, we have created designated Provider Care Teams.  These Care Teams include your primary Cardiologist (physician) and Advanced Practice Providers (APPs -  Physician Assistants and Nurse Practitioners) who all work together to provide you with the care you need, when you need it.  We recommend signing up for the patient portal called "MyChart".  Sign up information is provided on this After Visit Summary.  MyChart is used to connect with patients for Virtual Visits (Telemedicine).  Patients  are able to view lab/test results, encounter notes, upcoming appointments, etc.  Non-urgent messages can be sent to your provider as well.   To learn more about what you can do with MyChart, go to [email or web address].    Your next appointment:   1 year(s)  The format for your next appointment:   In Person  Provider:   Bobbijo Holst  Harrell Gave, MD   Recent data, summarized from [facility] from a study from the [facility]:  https://wilkins.info/  "Researchers at the [facility] set out to answer this question by comparing statins to supplements in a clinical trial. They tracked the outcomes of 190 adults, ages 20 to 26. Some participants were given a 5 mg daily dose of rosuvastatin, a statin that is sold under the brand name Crestor for 28 days. Others were given supplements, including fish oil, cinnamon, garlic, turmeric, plant sterols or red yeast rice for the same period."  "What we found was that rosuvastatin lowered LDL cholesterol by almost 38% and that was vastly superior to placebo and any of the six supplements studied in the trial," study Despina Hidden, M.D. of the [facility], [place] told NPR. He says this level of reduction is enough to lower the risk of heart attacks and strokes. The findings are published in the Journal of the [employer] of Cardiology.  "Oftentimes these supplements are marketed as 'natural ways' to lower your cholesterol," says Laffin. But he says none of the dietary supplements demonstrated any significant decrease in LDL cholesterol compared with a placebo. LDL cholesterol is considered the 'bad cholesterol' because it can contribute to plaque build-up in the artery walls - which can narrow the arteries, and set the stage for heart attacks and strokes"   I,Mykaella Javier,acting as a scribe  for [employer], MD.,have documented all relevant documentation on the behalf of Buford Dresser, MD,as directed by  Buford Dresser, MD while in the presence of Buford Dresser, MD.  I, Buford Dresser, MD, have reviewed all documentation for this visit. The documentation on 11/24/21 for the exam, diagnosis, procedures, and orders are all accurate and complete.   Signed, Buford Dresser, MD PhD 11/24/2021 11:47 AM    [place]

## 2021-11-24 NOTE — Telephone Encounter (Signed)
Pt scheduled for an appointment today 2/15 at 9:40 am

## 2021-11-24 NOTE — Patient Instructions (Signed)
Medication Instructions:  1) START: Aspirin 81 mg daily 2) START: Rosuvastatin 10 mg daily   *If you need a refill on your cardiac medications before your next appointment, please call your pharmacy*   Lab Work: Your provider has recommended lab work May, 2023 (Fasting Lipid/ LFT). Please have this collected at [facility] at [place]. The lab is open 8:00 am - 4:30 pm. Please avoid 12:00p - 1:00p for lunch hour. You do not need an appointment. Please go to [address] [place], [place] [address]. This is in the Primary Care office on the 3rd floor, let them know you are there for blood work and they will direct you to the lab.  If you have labs (blood work) drawn today and your tests are completely normal, you will receive your results only by: [place] (if you have MyChart) OR A paper copy in the mail If you have any lab test that is abnormal or we need to change your treatment, we will call you to review the results.   Testing/Procedures: None ordered today   Follow-Up: At [facility], you and your health needs are our priority.  As part of our continuing mission to provide you with exceptional heart care, we have created designated Provider Care Teams.  These Care Teams include your primary Cardiologist (physician) and Advanced Practice Providers (APPs -  Physician Assistants and Nurse Practitioners) who all work together to provide you with the care you need, when you need it.  We recommend signing up for the patient portal called "MyChart".  Sign up information is provided on this After Visit Summary.  MyChart is used to connect with patients for Virtual Visits (Telemedicine).  Patients are able to view lab/test results, encounter notes, upcoming appointments, etc.  Non-urgent messages can be sent to your provider as well.   To learn more about what you can do with MyChart, go to [email or web address].    Your next appointment:   1  year(s)  The format for your next appointment:   In Person  Provider:   Buford Dresser, MD   Recent data, summarized from [place] from a study from the [facility]:  https://wilkins.info/  "Researchers at the [facility] set out to answer this question by comparing statins to supplements in a clinical trial. They tracked the outcomes of 190 adults, ages 2 to 26. Some participants were given a 5 mg daily dose of rosuvastatin, a statin that is sold under the brand name Crestor for 28 days. Others were given supplements, including fish oil, cinnamon, garlic, turmeric, plant sterols or red yeast rice for the same period."  "What we found was that rosuvastatin lowered LDL cholesterol by almost 38% and that was vastly superior to placebo and any of the six supplements studied in the trial," study Despina Hidden, M.D. of the [facility], [place] told NPR. He says this level of reduction is enough to lower the risk of heart attacks and strokes. The findings are published in the Journal of the [employer] of Cardiology.  "Oftentimes these supplements are marketed as 'natural ways' to lower your cholesterol," says Laffin. But he says none of the dietary supplements demonstrated any significant decrease in LDL cholesterol compared with a placebo. LDL cholesterol is considered the 'bad cholesterol' because it can contribute to plaque build-up in the artery walls - which can narrow the arteries, and set the stage for heart attacks and strokes"

## 2021-12-06 ENCOUNTER — Ambulatory Visit (HOSPITAL_COMMUNITY)
Admission: RE | Admit: 2021-12-06 | Discharge: 2021-12-06 | Disposition: A | Discharge status: Home / Self Care | Payer: 59 | Source: Ambulatory Visit | Attending: Thoracic Surgery (Cardiothoracic Vascular Surgery) | Admitting: Thoracic Surgery (Cardiothoracic Vascular Surgery)

## 2021-12-06 ENCOUNTER — Other Ambulatory Visit: Payer: Self-pay

## 2021-12-06 ENCOUNTER — Institutional Professional Consult (permissible substitution): Payer: 59 | Admitting: Thoracic Surgery (Cardiothoracic Vascular Surgery)

## 2021-12-06 ENCOUNTER — Other Ambulatory Visit: Payer: Self-pay | Admitting: *Deleted

## 2021-12-06 VITALS — BP 161/94 | HR 81 | Resp 20 | Ht 66.0 in | Wt 160.0 lb

## 2021-12-06 DIAGNOSIS — J9859 Other diseases of mediastinum, not elsewhere classified: Secondary | ICD-10-CM

## 2021-12-06 LAB — GLUCOSE, CAPILLARY: Glucose-Capillary: 102 mg/dL — ABNORMAL HIGH (ref 70–99)

## 2021-12-06 MED ORDER — FLUDEOXYGLUCOSE F - 18 (FDG) INJECTION
8.1000 | Freq: Once | INTRAVENOUS | Status: AC
  Administered 2021-12-06: 7.9 via INTRAVENOUS

## 2021-12-06 NOTE — H&P (View-Only) (Signed)
PCP is Kelton Pillar, MD Referring Provider is Buford Dresser,*  Chief Complaint  Patient presents with   Mediastinal Mass    PET 2/27    HPI: [name] presents with an incidentally noted anterior mediastinal mass.  [name] is a 61 year old woman with a history of reflux, anxiety, and hyperlipidemia.  She has a past history of tobacco use quitting in 2011.  She recently had a CT for coronary calcium scoring.  Her coronary calcium score was 2.28 (61st percentile), minimal plaque in the LAD.  She was also noted to have a 2.3 x 1.6 x 2.4 cm anterior mediastinal mass.  She recently had an episode of palpitations while exercising.  She felt poorly for a couple days afterwards.  That led to her cardiac work-up.  She otherwise has been feeling well.  She denies any double vision or weakness.  She does have reflux and heartburn.  Zubrod Score: At the time of surgery this patients most appropriate activity status/level should be described as: [x]     0    Normal activity, no symptoms []     1    Restricted in physical strenuous activity but ambulatory, able to do out light work []     2    Ambulatory and capable of self care, unable to do work activities, up and about >50 % of waking hours                              []     3    Only limited self care, in bed greater than 50% of waking hours []     4    Completely disabled, no self care, confined to bed or chair []     5    Moribund  Past Medical History:  Diagnosis Date   Anxiety    Colon polyps    GERD (gastroesophageal reflux disease)    PONV (postoperative nausea and vomiting)     Past Surgical History:  Procedure Laterality Date   MASS EXCISION Right 07/20/2016   Procedure: EXCISION  OF FOREHEAD BONE MASS;  Surgeon: Wallace Going, DO;  Location: [facility];  Service: Plastics;  Laterality: Right;   PARTIAL KNEE ARTHROPLASTY Right 2010    Family History  Family history unknown: Yes    Social  History Social History   Tobacco Use   Smoking status: Former    Types: Cigarettes    Quit date: 01/23/2010    Years since quitting: 11.8   Smokeless tobacco: Never  Substance Use Topics   Alcohol use: No   Drug use: No    Current Outpatient Medications  Medication Sig Dispense Refill   ALPRAZolam (XANAX) 0.25 MG tablet Take 0.25 mg by mouth at bedtime as needed for anxiety.     Ascorbic Acid (VITAMIN C) 500 MG CAPS Take 1 tablet by mouth daily.     aspirin EC 81 MG tablet Take 1 tablet (81 mg total) by mouth daily. Swallow whole. 90 tablet 3   Cholecalciferol 25 MCG (1000 UT) tablet Take 1 tablet by mouth daily.     citalopram (CELEXA) 10 MG tablet Take 10 mg by mouth daily.     rosuvastatin (CRESTOR) 10 MG tablet Take 1 tablet (10 mg total) by mouth daily. 90 tablet 3   Turmeric (QC TUMERIC COMPLEX PO) Take 1 tablet by mouth daily at 12 noon.     Zinc 100 MG TABS Take 1 tablet  by mouth daily.     zolpidem (AMBIEN) 10 MG tablet Take 10 mg by mouth at bedtime as needed for sleep.     metoprolol tartrate (LOPRESSOR) 25 MG tablet TAKE 1 TABLET 2 HR PRIOR TO CARDIAC PROCEDURE 1 tablet 0   Omega-3 Fatty Acids (FISH OIL) 1000 MG CAPS Take 1 capsule by mouth daily.     No current facility-administered medications for this visit.    Allergies  Allergen Reactions   Cantaloupe Extract Allergy Skin Test Anaphylaxis   Watermelon Flavor Anaphylaxis   Codeine Nausea And Vomiting   Penicillins Rash    Review of Systems  Constitutional:  Negative for activity change and unexpected weight change.  HENT:  Negative for trouble swallowing and voice change.   Eyes:  Positive for visual disturbance (Cataract, no diplopia).  Respiratory:  Negative for shortness of breath and wheezing.   Cardiovascular:  Positive for palpitations. Negative for chest pain and leg swelling.  Gastrointestinal:  Positive for abdominal pain (Heartburn/hiatal hernia).  Musculoskeletal:  Positive for arthralgias.   Hematological:  Negative for adenopathy. Does not bruise/bleed easily.  Psychiatric/Behavioral:  The patient is nervous/anxious.    BP (!) 161/94 (BP Location: Right Arm, Patient Position: Sitting)    Pulse 81    Resp 20    Ht 5\' 6"  (1.676 m)    Wt 160 lb (72.6 kg)    SpO2 99% Comment: RA   BMI 25.82 kg/m  Physical Exam Vitals reviewed.  Constitutional:      General: She is not in acute distress.    Appearance: Normal appearance.  HENT:     Head: Normocephalic and atraumatic.  Eyes:     General: No scleral icterus.    Extraocular Movements: Extraocular movements intact.  Neck:     Vascular: No carotid bruit.  Cardiovascular:     Rate and Rhythm: Normal rate and regular rhythm.     Pulses: Normal pulses.     Heart sounds: Normal heart sounds. No murmur heard.   No friction rub. No gallop.  Pulmonary:     Effort: Pulmonary effort is normal.     Breath sounds: Normal breath sounds. No wheezing.  Abdominal:     General: There is no distension.     Palpations: Abdomen is soft.     Tenderness: There is no abdominal tenderness.  Musculoskeletal:     Cervical back: Neck supple.  Lymphadenopathy:     Cervical: No cervical adenopathy.  Skin:    General: Skin is warm and dry.  Neurological:     General: No focal deficit present.     Mental Status: She is alert and oriented to person, place, and time.     Cranial Nerves: No cranial nerve deficit.     Motor: No weakness.   Diagnostic Tests: OVER-READ INTERPRETATION  CT CHEST   The following report is an over-read performed by radiologist Dr. Aletta Edouard of [facility], [place] on 11/23/2021. This over-read does not include interpretation of cardiac or coronary anatomy or pathology. The coronary CTA interpretation by the cardiologist is attached.   COMPARISON:  CT of the chest on 12/26/2013   FINDINGS: Vascular: No significant noncardiac vascular findings.   Mediastinum/Nodes: Visualized mediastinum and hilar  regions demonstrate an oval-shaped anterior mediastinal mass just anterior to the ascending thoracic aorta on axial images 3-14 not present on the prior chest CT. This soft tissue measures approximately 2.3 x 1.6 x 2.4 cm. Statistically this most likely represents an enlarged lymph node.  In the anterior mediastinum, thymoma is also in the differential. No other lymphadenopathy visualized. However, the entire mediastinum and hilar regions are not imaged on the current study. There is a stable small hiatal hernia.   Lungs/Pleura: Stable calcified left upper lobe granuloma. Visualized lungs show no evidence of pulmonary edema, consolidation, pneumothorax or pleural fluid.   Upper Abdomen: No acute abnormality.   Musculoskeletal: No chest wall mass or suspicious bone lesions identified.   IMPRESSION: Soft tissue mass in the visualized anterior mediastinum just to the right of midline and anterior to the ascending thoracic aorta with estimated dimensions of approximately 2.3 x 1.6 x 2.4 cm. This statistically likely represents an enlarged lymph node. Initial evaluation is recommended with a CT of the entire chest with IV contrast. Correlation also suggested with any palpable lymphadenopathy in other areas of the body by physical examination which may lead to a need to image additional parts of the body.   Electronically Signed: By: Aletta Edouard M.D. On: 11/23/2021 11:05    OVER-READ INTERPRETATION  CT CHEST   The following report is an over-read performed by radiologist Dr. Aletta Edouard of [facility], [place] on 11/23/2021. This over-read does not include interpretation of cardiac or coronary anatomy or pathology. The coronary CTA interpretation by the cardiologist is attached.   COMPARISON:  CT of the chest on 12/26/2013   FINDINGS: Vascular: No significant noncardiac vascular findings.   Mediastinum/Nodes: Visualized mediastinum and hilar regions demonstrate an  oval-shaped anterior mediastinal mass just anterior to the ascending thoracic aorta on axial images 3-14 not present on the prior chest CT. This soft tissue measures approximately 2.3 x 1.6 x 2.4 cm. Statistically this most likely represents an enlarged lymph node. In the anterior mediastinum, thymoma is also in the differential. No other lymphadenopathy visualized. However, the entire mediastinum and hilar regions are not imaged on the current study. There is a stable small hiatal hernia.   Lungs/Pleura: Stable calcified left upper lobe granuloma. Visualized lungs show no evidence of pulmonary edema, consolidation, pneumothorax or pleural fluid.   Upper Abdomen: No acute abnormality.   Musculoskeletal: No chest wall mass or suspicious bone lesions identified.   IMPRESSION: Soft tissue mass in the visualized anterior mediastinum just to the right of midline and anterior to the ascending thoracic aorta with estimated dimensions of approximately 2.3 x 1.6 x 2.4 cm. This statistically likely represents an enlarged lymph node. Initial evaluation is recommended with a CT of the entire chest with IV contrast. Correlation also suggested with any palpable lymphadenopathy in other areas of the body by physical examination which may lead to a need to image additional parts of the body.   Electronically Signed: By: Aletta Edouard M.D. On: 11/23/2021 11:05   I personally reviewed the CT images which showed a 2.3 x 1.6 x 2.4 cm anterior mediastinal mass.  I also reviewed the head images.  The official reading is not yet available but the mass does have recommended activity.  No signs of adenopathy within the chest.  Impression: [name] is a 61 year old woman with a history of reflux, anxiety, and hyperlipidemia.  She recently had an anterior mediastinal mass found on a CT done for coronary calcium scoring.  On PET/CT there is hypermetabolic activity in the nodule.  I reviewed the  images with Jeani Hawking and Louie Casa.  We discussed the differential diagnosis.  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  There is no evidence of invasion of surrounding structures.  Lymphoma, germ  cell tumors, and ectopic thyroid are also in the differential diagnosis, but less likely.  I recommended that we proceed with robotic right VATS for resection of the anterior mediastinal mass.  I described the procedure to them.  They understand the need for general anesthesia, the incisions to be used, the use of drains to postoperatively, the expected hospital stay, and the overall recovery.  I informed them of the indications, risks, benefits, and alternatives.  They understand the risks include, but are not limited to death, MI, DVT, PE, bleeding, possible need for transfusion, infection, phrenic nerve injury leading to diaphragm dysfunction, as well as possibility of other unforeseeable complications.  She accepts the risks and wishes to proceed.  Plan: Robotic right VATS for resection of anterior mediastinal mass on Friday, 12/10/2021  [name], MD [facility] [phone]

## 2021-12-06 NOTE — Progress Notes (Signed)
PCP is Kelton Pillar, MD Referring Provider is Buford Dresser,*  Chief Complaint  Patient presents with   Mediastinal Mass    PET 2/27    HPI: [name] presents with an incidentally noted anterior mediastinal mass.  [name] is a 61 year old woman with a history of reflux, anxiety, and hyperlipidemia.  She has a past history of tobacco use quitting in 2011.  She recently had a CT for coronary calcium scoring.  Her coronary calcium Lewis was 2.28 (61st percentile), minimal plaque in the LAD.  She was also noted to have a 2.3 x 1.6 x 2.4 cm anterior mediastinal mass.  She recently had an episode of palpitations while exercising.  She felt poorly for a couple days afterwards.  That led to her cardiac work-up.  She otherwise has been feeling well.  She denies any double vision or weakness.  She does have reflux and heartburn.  [name]: At the time of surgery this patients most appropriate activity status/level should be described as: [x]     0    Normal activity, no symptoms []     1    Restricted in physical strenuous activity but ambulatory, able to do out light work []     2    Ambulatory and capable of self care, unable to do work activities, up and about >50 % of waking hours                              []     3    Only limited self care, in bed greater than 50% of waking hours []     4    Completely disabled, no self care, confined to bed or chair []     5    Moribund  Past Medical History:  Diagnosis Date   Anxiety    Colon polyps    GERD (gastroesophageal reflux disease)    PONV (postoperative nausea and vomiting)     Past Surgical History:  Procedure Laterality Date   MASS EXCISION Right 07/20/2016   Procedure: EXCISION  OF FOREHEAD BONE MASS;  Surgeon: Wallace Going, DO;  Location: [facility];  Service: Plastics;  Laterality: Right;   PARTIAL KNEE ARTHROPLASTY Right 2010    Family History  Family history unknown: Yes    Social  History Social History   Tobacco Use   Smoking status: Former    Types: Cigarettes    Quit date: 01/23/2010    Years since quitting: 11.8   Smokeless tobacco: Never  Substance Use Topics   Alcohol use: No   Drug use: No    Current Outpatient Medications  Medication Sig Dispense Refill   ALPRAZolam (XANAX) 0.25 MG tablet Take 0.25 mg by mouth at bedtime as needed for anxiety.     Ascorbic Acid (VITAMIN C) 500 MG CAPS Take 1 tablet by mouth daily.     aspirin EC 81 MG tablet Take 1 tablet (81 mg total) by mouth daily. Swallow whole. 90 tablet 3   Cholecalciferol 25 MCG (1000 UT) tablet Take 1 tablet by mouth daily.     citalopram (CELEXA) 10 MG tablet Take 10 mg by mouth daily.     rosuvastatin (CRESTOR) 10 MG tablet Take 1 tablet (10 mg total) by mouth daily. 90 tablet 3   Turmeric (QC TUMERIC COMPLEX PO) Take 1 tablet by mouth daily at 12 noon.     Zinc 100 MG TABS Take 1 tablet  by mouth daily.     zolpidem (AMBIEN) 10 MG tablet Take 10 mg by mouth at bedtime as needed for sleep.     metoprolol tartrate (LOPRESSOR) 25 MG tablet TAKE 1 TABLET 2 HR PRIOR TO CARDIAC PROCEDURE 1 tablet 0   Omega-3 Fatty Acids (FISH OIL) 1000 MG CAPS Take 1 capsule by mouth daily.     No current facility-administered medications for this visit.    Allergies  Allergen Reactions   Cantaloupe Extract Allergy Skin Test Anaphylaxis   Watermelon Flavor Anaphylaxis   Codeine Nausea And Vomiting   Penicillins Rash    Review of Systems  Constitutional:  Negative for activity change and unexpected weight change.  HENT:  Negative for trouble swallowing and voice change.   Eyes:  Positive for visual disturbance (Cataract, no diplopia).  Respiratory:  Negative for shortness of breath and wheezing.   Cardiovascular:  Positive for palpitations. Negative for chest pain and leg swelling.  Gastrointestinal:  Positive for abdominal pain (Heartburn/hiatal hernia).  Musculoskeletal:  Positive for arthralgias.   Hematological:  Negative for adenopathy. Does not bruise/bleed easily.  Psychiatric/Behavioral:  The patient is nervous/anxious.    BP (!) 161/94 (BP Location: Right Arm, Patient Position: Sitting)    Pulse 81    Resp 20    Ht 5\' 6"  (1.676 m)    Wt 160 lb (72.6 kg)    SpO2 99% Comment: RA   BMI 25.82 kg/m  Physical Exam Vitals reviewed.  Constitutional:      General: She is not in acute distress.    Appearance: Normal appearance.  HENT:     Head: Normocephalic and atraumatic.  Eyes:     General: No scleral icterus.    Extraocular Movements: Extraocular movements intact.  Neck:     Vascular: No carotid bruit.  Cardiovascular:     Rate and Rhythm: Normal rate and regular rhythm.     Pulses: Normal pulses.     Heart sounds: Normal heart sounds. No murmur heard.   No friction rub. No gallop.  Pulmonary:     Effort: Pulmonary effort is normal.     Breath sounds: Normal breath sounds. No wheezing.  Abdominal:     General: There is no distension.     Palpations: Abdomen is soft.     Tenderness: There is no abdominal tenderness.  Musculoskeletal:     Cervical back: Neck supple.  Lymphadenopathy:     Cervical: No cervical adenopathy.  Skin:    General: Skin is warm and dry.  Neurological:     General: No focal deficit present.     Mental Status: She is alert and oriented to person, place, and time.     Cranial Nerves: No cranial nerve deficit.     Motor: No weakness.   Diagnostic Tests: OVER-READ INTERPRETATION  CT CHEST   The following report is an over-read performed by radiologist Dr. Aletta Lewis of [facility], [place] on 11/23/2021. This over-read does not include interpretation of cardiac or coronary anatomy or pathology. The coronary CTA interpretation by the cardiologist is attached.   COMPARISON:  CT of the chest on 12/26/2013   FINDINGS: Vascular: No significant noncardiac vascular findings.   Mediastinum/Nodes: Visualized mediastinum and hilar  regions demonstrate an oval-shaped anterior mediastinal mass just anterior to the ascending thoracic aorta on axial images 3-14 not present on the prior chest CT. This soft tissue measures approximately 2.3 x 1.6 x 2.4 cm. Statistically this most likely represents an enlarged lymph node.  In the anterior mediastinum, thymoma is also in the differential. No other lymphadenopathy visualized. However, the entire mediastinum and hilar regions are not imaged on the current study. There is a stable small hiatal hernia.   Lungs/Pleura: Stable calcified left upper lobe granuloma. Visualized lungs show no evidence of pulmonary edema, consolidation, pneumothorax or pleural fluid.   Upper Abdomen: No acute abnormality.   Musculoskeletal: No chest wall mass or suspicious bone lesions identified.   IMPRESSION: Soft tissue mass in the visualized anterior mediastinum just to the right of midline and anterior to the ascending thoracic aorta with estimated dimensions of approximately 2.3 x 1.6 x 2.4 cm. This statistically likely represents an enlarged lymph node. Initial evaluation is recommended with a CT of the entire chest with IV contrast. Correlation also suggested with any palpable lymphadenopathy in other areas of the body by physical examination which may lead to a need to image additional parts of the body.   Electronically Signed: By: [name] M.D. On: 11/23/2021 11:05    OVER-READ INTERPRETATION  CT CHEST   The following report is an over-read performed by radiologist Dr. Aletta Lewis of [facility], [place] on 11/23/2021. This over-read does not include interpretation of cardiac or coronary anatomy or pathology. The coronary CTA interpretation by the cardiologist is attached.   COMPARISON:  CT of the chest on 12/26/2013   FINDINGS: Vascular: No significant noncardiac vascular findings.   Mediastinum/Nodes: Visualized mediastinum and hilar regions demonstrate an  oval-shaped anterior mediastinal mass just anterior to the ascending thoracic aorta on axial images 3-14 not present on the prior chest CT. This soft tissue measures approximately 2.3 x 1.6 x 2.4 cm. Statistically this most likely represents an enlarged lymph node. In the anterior mediastinum, thymoma is also in the differential. No other lymphadenopathy visualized. However, the entire mediastinum and hilar regions are not imaged on the current study. There is a stable small hiatal hernia.   Lungs/Pleura: Stable calcified left upper lobe granuloma. Visualized lungs show no evidence of pulmonary edema, consolidation, pneumothorax or pleural fluid.   Upper Abdomen: No acute abnormality.   Musculoskeletal: No chest wall mass or suspicious bone lesions identified.   IMPRESSION: Soft tissue mass in the visualized anterior mediastinum just to the right of midline and anterior to the ascending thoracic aorta with estimated dimensions of approximately 2.3 x 1.6 x 2.4 cm. This statistically likely represents an enlarged lymph node. Initial evaluation is recommended with a CT of the entire chest with IV contrast. Correlation also suggested with any palpable lymphadenopathy in other areas of the body by physical examination which may lead to a need to image additional parts of the body.   Electronically Signed: By: [name] M.D. On: 11/23/2021 11:05   I personally reviewed the CT images which showed a 2.3 x 1.6 x 2.4 cm anterior mediastinal mass.  I also reviewed the head images.  The official reading is not yet available but the mass does have recommended activity.  No signs of adenopathy within the chest.  Impression: [name] is a 61 year old woman with a history of reflux, anxiety, and hyperlipidemia.  She recently had an anterior mediastinal mass found on a CT done for coronary calcium scoring.  On PET/CT there is hypermetabolic activity in the nodule.  I reviewed the  images with [name] and [name].  We discussed the differential diagnosis.  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  There is no evidence of invasion of surrounding structures.  Lymphoma, germ  cell tumors, and ectopic thyroid are also in the differential diagnosis, but less likely.  I recommended that we proceed with robotic right VATS for resection of the anterior mediastinal mass.  I described the procedure to them.  They understand the need for general anesthesia, the incisions to be used, the use of drains to postoperatively, the expected hospital stay, and the overall recovery.  I informed them of the indications, risks, benefits, and alternatives.  They understand the risks include, but are not limited to death, MI, DVT, PE, bleeding, possible need for transfusion, infection, phrenic nerve injury leading to diaphragm dysfunction, as well as possibility of other unforeseeable complications.  She accepts the risks and wishes to proceed.  Plan: Robotic right VATS for resection of anterior mediastinal mass on Friday, 12/10/2021  [name], MD [facility] [phone]

## 2021-12-07 NOTE — Progress Notes (Signed)
Surgical Instructions ? ? ? Your procedure is scheduled on Friday, March 3rd, 2023. ? ? Report to [facility] Main Entrance "A" at 05:30 A.M., then check in with the Admitting office. ? Call this number if you have problems the morning of surgery: ? [phone] ? ? If you have any questions prior to your surgery date call [phone]: Open Monday-Friday 8am-4pm ? ? ? Remember: ? Do not eat or drink after midnight the night before your surgery ? ? Take these medicines the morning of surgery with A SIP OF WATER:  ? ?citalopram (CELEXA) ?metoprolol tartrate (LOPRESSOR)  ?rosuvastatin (CRESTOR)  ? ?If needed: ? ?ALPRAZolam Duanne Moron)  ? ?Follow your surgeon's instructions on when to stop Aspirin.  If no instructions were given by your surgeon then you will need to call the office to get those instructions.    ? ?As of today, STOP taking any Aspirin (unless otherwise instructed by your surgeon) Aleve, Naproxen, Ibuprofen, Motrin, Advil, Goody's, BC's, all herbal medications, fish oil, and all vitamins. ? ? ? The day of surgery: ?         ?Do not wear jewelry or makeup ?Do not wear lotions, powders, perfumes/, or deodorant. ?Do not shave 48 hours prior to surgery.   ?Do not bring valuables to the hospital. ?Do not wear nail polish, gel polish, artificial nails, or any other type of covering on natural nails (fingers and toes) ?If you have artificial nails or gel coating that need to be removed by a nail salon, please have this removed prior to surgery. Artificial nails or gel coating may interfere with anesthesia's ability to adequately monitor your vital signs. ? ? ?[facility] is not responsible for any belongings or valuables. .  ? ?Do NOT Smoke (Tobacco/Vaping)  24 hours prior to your procedure ? ?If you use a CPAP at night, you may bring your mask for your overnight stay. ?  ?Contacts, glasses, hearing aids, dentures or partials may not be worn into surgery, please bring cases for these belongings ?  ?For patients  admitted to the hospital, discharge time will be determined by your treatment team. ?  ?Patients discharged the day of surgery will not be allowed to drive home, and someone needs to stay with them for 24 hours. ? ?NO VISITORS WILL BE ALLOWED IN PRE-OP WHERE PATIENTS ARE PREPPED FOR SURGERY.  ONLY 1 SUPPORT PERSON MAY BE PRESENT IN THE WAITING ROOM WHILE YOU ARE IN SURGERY.  IF YOU ARE TO BE ADMITTED, ONCE YOU ARE IN YOUR ROOM YOU WILL BE ALLOWED TWO (2) VISITORS. 1 (ONE) VISITOR MAY STAY OVERNIGHT BUT MUST ARRIVE TO THE ROOM BY 8pm.  Minor children may have two parents present. Special consideration for safety and communication needs will be reviewed on a case by case basis. ? ?Special instructions:   ? ?Oral Hygiene is also important to reduce your risk of infection.  Remember - BRUSH YOUR TEETH THE MORNING OF SURGERY WITH YOUR REGULAR TOOTHPASTE ? ? ?[facility]- Preparing For Surgery ? ?Before surgery, you can play an important role. Because skin is not sterile, your skin needs to be as free of germs as possible. You can reduce the number of germs on your skin by washing with CHG (chlorahexidine gluconate) Soap before surgery.  CHG is an antiseptic cleaner which kills germs and bonds with the skin to continue killing germs even after washing.   ? ? ?Please do not use if you have an allergy to CHG or antibacterial  soaps. If your skin becomes reddened/irritated stop using the CHG.  ?Do not shave (including legs and underarms) for at least 48 hours prior to first CHG shower. It is OK to shave your face. ? ?Please follow these instructions carefully. ?  ? ? Shower the NIGHT BEFORE SURGERY and the MORNING OF SURGERY with CHG Soap.  ? If you chose to wash your hair, wash your hair first as usual with your normal shampoo. After you shampoo, rinse your hair and body thoroughly to remove the shampoo.  Then [employer] and genitals (private parts) with your normal soap and rinse thoroughly to remove soap. ? ?After that Use  CHG Soap as you would any other liquid soap. You can apply CHG directly to the skin and wash gently with a scrungie or a clean washcloth.  ? ?Apply the CHG Soap to your body ONLY FROM THE NECK DOWN.  Do not use on open wounds or open sores. Avoid contact with your eyes, ears, mouth and genitals (private parts). Wash Face and genitals (private parts)  with your normal soap.  ? ?Wash thoroughly, paying special attention to the area where your surgery will be performed. ? ?Thoroughly rinse your body with warm water from the neck down. ? ?DO NOT shower/wash with your normal soap after using and rinsing off the CHG Soap. ? ?Pat yourself dry with a CLEAN TOWEL. ? ?Wear CLEAN PAJAMAS to bed the night before surgery ? ?Place CLEAN SHEETS on your bed the night before your surgery ? ?DO NOT SLEEP WITH PETS. ? ? ?Day of Surgery: ? ?Take a shower with CHG soap. ?Wear Clean/Comfortable clothing the morning of surgery ?Do not apply any deodorants/lotions.   ?Remember to brush your teeth WITH YOUR REGULAR TOOTHPASTE. ? ? ? ?COVID testing ? ?If you are going to stay overnight or be admitted after your procedure/surgery and require a pre-op COVID test, please follow these instructions after your COVID test  ? ?You are not required to quarantine however you are required to wear a well-fitting mask when you are out and around people not in your household.  If your mask becomes wet or soiled, replace with a new one. ? ?Wash your hands often with soap and water for 20 seconds or clean your hands with an alcohol-based hand sanitizer that contains at least 60% alcohol. ? ?Do not share personal items. ? ?Notify your provider: ?if you are in close contact with someone who has COVID  ?or if you develop a fever of 100.4 or greater, sneezing, cough, sore throat, shortness of breath or body aches. ? ?  ?Please read over the following fact sheets that you were given.   ?

## 2021-12-08 ENCOUNTER — Encounter (HOSPITAL_COMMUNITY)
Admission: RE | Admit: 2021-12-08 | Discharge: 2021-12-08 | Disposition: A | Discharge status: Home / Self Care | Payer: 59 | Source: Ambulatory Visit | Attending: Thoracic Surgery (Cardiothoracic Vascular Surgery) | Admitting: Thoracic Surgery (Cardiothoracic Vascular Surgery)

## 2021-12-08 ENCOUNTER — Other Ambulatory Visit: Payer: Self-pay

## 2021-12-08 ENCOUNTER — Ambulatory Visit (HOSPITAL_COMMUNITY)
Admission: RE | Admit: 2021-12-08 | Discharge: 2021-12-08 | Disposition: A | Discharge status: Home / Self Care | Payer: 59 | Source: Ambulatory Visit | Attending: Thoracic Surgery (Cardiothoracic Vascular Surgery) | Admitting: Thoracic Surgery (Cardiothoracic Vascular Surgery)

## 2021-12-08 ENCOUNTER — Encounter (HOSPITAL_COMMUNITY): Payer: Self-pay

## 2021-12-08 VITALS — BP 149/89 | HR 71 | Temp 98.2°F | Resp 17 | Ht 66.0 in | Wt 164.1 lb

## 2021-12-08 DIAGNOSIS — J9859 Other diseases of mediastinum, not elsewhere classified: Secondary | ICD-10-CM | POA: Insufficient documentation

## 2021-12-08 DIAGNOSIS — Z20822 Contact with and (suspected) exposure to covid-19: Secondary | ICD-10-CM | POA: Insufficient documentation

## 2021-12-08 DIAGNOSIS — Z01818 Encounter for other preprocedural examination: Secondary | ICD-10-CM | POA: Insufficient documentation

## 2021-12-08 LAB — BLOOD GAS, ARTERIAL
Acid-Base Excess: 2.4 mmol/L — ABNORMAL HIGH (ref 0.0–2.0)
Bicarbonate: 27.2 mmol/L (ref 20.0–28.0)
Drawn by: 60286
O2 Saturation: 97.6 %
Patient temperature: 37
pCO2 arterial: 42 mmHg (ref 32–48)
pH, Arterial: 7.42 (ref 7.35–7.45)
pO2, Arterial: 107 mmHg (ref 83–108)

## 2021-12-08 LAB — URINALYSIS, ROUTINE W REFLEX MICROSCOPIC
Bacteria, UA: NONE SEEN
Bilirubin Urine: NEGATIVE
Glucose, UA: NEGATIVE mg/dL
Hgb urine dipstick: NEGATIVE
Ketones, ur: NEGATIVE mg/dL
Nitrite: NEGATIVE
Protein, ur: NEGATIVE mg/dL
Specific Gravity, Urine: 1.015 (ref 1.005–1.030)
pH: 7 (ref 5.0–8.0)

## 2021-12-08 LAB — CBC
HCT: 41.9 % (ref 36.0–46.0)
Hemoglobin: 13.5 g/dL (ref 12.0–15.0)
MCH: 31.4 pg (ref 26.0–34.0)
MCHC: 32.2 g/dL (ref 30.0–36.0)
MCV: 97.4 fL (ref 80.0–100.0)
Platelets: 196 10*3/uL (ref 150–400)
RBC: 4.3 MIL/uL (ref 3.87–5.11)
RDW: 12.2 % (ref 11.5–15.5)
WBC: 5.3 10*3/uL (ref 4.0–10.5)
nRBC: 0 % (ref 0.0–0.2)

## 2021-12-08 LAB — COMPREHENSIVE METABOLIC PANEL
ALT: 26 U/L (ref 0–44)
AST: 28 U/L (ref 15–41)
Albumin: 3.7 g/dL (ref 3.5–5.0)
Alkaline Phosphatase: 74 U/L (ref 38–126)
Anion gap: 9 (ref 5–15)
BUN: 19 mg/dL (ref 8–23)
CO2: 22 mmol/L (ref 22–32)
Calcium: 8.9 mg/dL (ref 8.9–10.3)
Chloride: 107 mmol/L (ref 98–111)
Creatinine, Ser: 0.67 mg/dL (ref 0.44–1.00)
GFR, Estimated: >60 mL/min (ref >60)
Glucose, Bld: 102 mg/dL — ABNORMAL HIGH (ref 70–99)
Potassium: 4.4 mmol/L (ref 3.5–5.1)
Sodium: 138 mmol/L (ref 135–145)
Total Bilirubin: 0.4 mg/dL (ref 0.3–1.2)
Total Protein: 6.2 g/dL — ABNORMAL LOW (ref 6.5–8.1)

## 2021-12-08 LAB — SURGICAL PCR SCREEN
MRSA, PCR: NEGATIVE
Staphylococcus aureus: POSITIVE — AB

## 2021-12-08 LAB — APTT: aPTT: 28 seconds (ref 24–36)

## 2021-12-08 LAB — PROTIME-INR
INR: 0.9 (ref 0.8–1.2)
Prothrombin Time: 12.6 seconds (ref 11.4–15.2)

## 2021-12-08 LAB — SARS CORONAVIRUS 2 BY RT PCR (HOSPITAL ORDER, PERFORMED IN ~~LOC~~ HOSPITAL LAB): SARS Coronavirus 2: NEGATIVE

## 2021-12-08 NOTE — Progress Notes (Signed)
Surgical Instructions ? ? ? Your procedure is scheduled on Friday, March 3rd, 2023. ? ? Report to [facility] Main Entrance "A" at 05:30 A.M., then check in with the Admitting office. ? Call this number if you have problems the morning of surgery: ? [phone] ? ? If you have any questions prior to your surgery date call [phone]: Open Monday-Friday 8am-4pm ? ? ? Remember: ? Do not eat or drink after midnight the night before your surgery ? ? Take these medicines the morning of surgery with A SIP OF WATER:  ? ?Citalopram (CELEXA) ?Rosuvastatin (CRESTOR)  ?Cetirizine (ZYRTEC) ?Lifitegrast (Xiidra)--eye drops ? ? ?Follow your surgeon's instructions on when to stop Aspirin.  If no instructions were given by your surgeon then you will need to call the office to get those instructions.    ? ?As of today, STOP taking any Aspirin (unless otherwise instructed by your surgeon) Aleve, Naproxen, Ibuprofen, Motrin, Advil, Goody's, BC's, all herbal medications, fish oil, and all vitamins. ? ? ? The day of surgery: ?         ?Do not wear jewelry or makeup ?Do not wear lotions, powders, perfumes/, or deodorant. ?Do not shave 48 hours prior to surgery.   ?Do not bring valuables to the hospital. ?Do not wear nail polish, gel polish, artificial nails, or any other type of covering on natural nails (fingers and toes) ?If you have artificial nails or gel coating that need to be removed by a nail salon, please have this removed prior to surgery. Artificial nails or gel coating may interfere with anesthesia's ability to adequately monitor your vital signs. ? ? ?[facility] is not responsible for any belongings or valuables. .  ? ?Do NOT Smoke (Tobacco/Vaping)  24 hours prior to your procedure ? ?If you use a CPAP at night, you may bring your mask for your overnight stay. ?  ?Contacts, glasses, hearing aids, dentures or partials may not be worn into surgery, please bring cases for these belongings ?  ?For patients admitted to the  hospital, discharge time will be determined by your treatment team. ?  ?Patients discharged the day of surgery will not be allowed to drive home, and someone needs to stay with them for 24 hours. ? ?NO VISITORS WILL BE ALLOWED IN PRE-OP WHERE PATIENTS ARE PREPPED FOR SURGERY.  ONLY 1 SUPPORT PERSON MAY BE PRESENT IN THE WAITING ROOM WHILE YOU ARE IN SURGERY.  IF YOU ARE TO BE ADMITTED, ONCE YOU ARE IN YOUR ROOM YOU WILL BE ALLOWED TWO (2) VISITORS. 1 (ONE) VISITOR MAY STAY OVERNIGHT BUT MUST ARRIVE TO THE ROOM BY 8pm.  Minor children may have two parents present. Special consideration for safety and communication needs will be reviewed on a case by case basis. ? ?Special instructions:   ? ?Oral Hygiene is also important to reduce your risk of infection.  Remember - BRUSH YOUR TEETH THE MORNING OF SURGERY WITH YOUR REGULAR TOOTHPASTE ? ? ?[facility]- Preparing For Surgery ? ?Before surgery, you can play an important role. Because skin is not sterile, your skin needs to be as free of germs as possible. You can reduce the number of germs on your skin by washing with CHG (chlorahexidine gluconate) Soap before surgery.  CHG is an antiseptic cleaner which kills germs and bonds with the skin to continue killing germs even after washing.   ? ? ?Please do not use if you have an allergy to CHG or antibacterial soaps. If your skin becomes  reddened/irritated stop using the CHG.  ?Do not shave (including legs and underarms) for at least 48 hours prior to first CHG shower. It is OK to shave your face. ? ?Please follow these instructions carefully. ?  ? ? Shower the NIGHT BEFORE SURGERY and the MORNING OF SURGERY with CHG Soap.  ? If you chose to wash your hair, wash your hair first as usual with your normal shampoo. After you shampoo, rinse your hair and body thoroughly to remove the shampoo.  Then [employer] and genitals (private parts) with your normal soap and rinse thoroughly to remove soap. ? ?After that Use CHG Soap as you  would any other liquid soap. You can apply CHG directly to the skin and wash gently with a scrungie or a clean washcloth.  ? ?Apply the CHG Soap to your body ONLY FROM THE NECK DOWN.  Do not use on open wounds or open sores. Avoid contact with your eyes, ears, mouth and genitals (private parts). Wash Face and genitals (private parts)  with your normal soap.  ? ?Wash thoroughly, paying special attention to the area where your surgery will be performed. ? ?Thoroughly rinse your body with warm water from the neck down. ? ?DO NOT shower/wash with your normal soap after using and rinsing off the CHG Soap. ? ?Pat yourself dry with a CLEAN TOWEL. ? ?Wear CLEAN PAJAMAS to bed the night before surgery ? ?Place CLEAN SHEETS on your bed the night before your surgery ? ?DO NOT SLEEP WITH PETS. ? ? ?Day of Surgery: ? ?Take a shower with CHG soap. ?Wear Clean/Comfortable clothing the morning of surgery ?Do not apply any deodorants/lotions.   ?Remember to brush your teeth WITH YOUR REGULAR TOOTHPASTE. ? ? ? ?COVID testing ? ?If you are going to stay overnight or be admitted after your procedure/surgery and require a pre-op COVID test, please follow these instructions after your COVID test  ? ?You are not required to quarantine however you are required to wear a well-fitting mask when you are out and around people not in your household.  If your mask becomes wet or soiled, replace with a new one. ? ?Wash your hands often with soap and water for 20 seconds or clean your hands with an alcohol-based hand sanitizer that contains at least 60% alcohol. ? ?Do not share personal items. ? ?Notify your provider: ?if you are in close contact with someone who has COVID  ?or if you develop a fever of 100.4 or greater, sneezing, cough, sore throat, shortness of breath or body aches. ? ?  ?Please read over the following fact sheets that you were given.   ?

## 2021-12-08 NOTE — Progress Notes (Addendum)
PCP: Dr. Kelton Pillar ?Cardiologist: Dr. Buford Dresser ? ?EKG: today ?CXR: today ?ECHO: denies ?Stress Test: denies ?Cardiac Cath: denies ? ?ERAS: NPO ? ?Covid: tested today ? ?Patient denies shortness of breath, fever, cough, and chest pain at PAT appointment. ? ?Patient verbalized understanding of instructions provided today at the PAT appointment.  Patient asked to review instructions at home and day of surgery.  ? ? ?Addendum: ?Surgeon's office made aware of UA results ?

## 2021-12-09 ENCOUNTER — Encounter (HOSPITAL_COMMUNITY): Payer: Self-pay | Admitting: Thoracic Surgery (Cardiothoracic Vascular Surgery)

## 2021-12-10 ENCOUNTER — Other Ambulatory Visit: Payer: Self-pay

## 2021-12-10 ENCOUNTER — Inpatient Hospital Stay (HOSPITAL_COMMUNITY)
Admission: RE | Admit: 2021-12-10 | Discharge: 2021-12-11 | DRG: 828 | Disposition: A | Discharge status: Home / Self Care | Payer: 59 | Attending: Thoracic Surgery (Cardiothoracic Vascular Surgery) | Admitting: Thoracic Surgery (Cardiothoracic Vascular Surgery)

## 2021-12-10 ENCOUNTER — Observation Stay (HOSPITAL_COMMUNITY): Payer: 59

## 2021-12-10 ENCOUNTER — Inpatient Hospital Stay (HOSPITAL_COMMUNITY): Payer: 59 | Admitting: Certified Registered Nurse Anesthetist

## 2021-12-10 ENCOUNTER — Encounter (HOSPITAL_COMMUNITY)
Admission: RE | Disposition: A | Discharge status: Home / Self Care | Payer: Self-pay | Source: Home / Self Care | Attending: Thoracic Surgery (Cardiothoracic Vascular Surgery)

## 2021-12-10 DIAGNOSIS — C37 Malignant neoplasm of thymus: Principal | ICD-10-CM | POA: Diagnosis present

## 2021-12-10 DIAGNOSIS — J9859 Other diseases of mediastinum, not elsewhere classified: Principal | ICD-10-CM | POA: Diagnosis present

## 2021-12-10 DIAGNOSIS — Z91018 Allergy to other foods: Secondary | ICD-10-CM

## 2021-12-10 DIAGNOSIS — K219 Gastro-esophageal reflux disease without esophagitis: Secondary | ICD-10-CM | POA: Diagnosis present

## 2021-12-10 DIAGNOSIS — Z20822 Contact with and (suspected) exposure to covid-19: Secondary | ICD-10-CM | POA: Diagnosis present

## 2021-12-10 DIAGNOSIS — Z885 Allergy status to narcotic agent status: Secondary | ICD-10-CM | POA: Diagnosis not present

## 2021-12-10 DIAGNOSIS — F419 Anxiety disorder, unspecified: Secondary | ICD-10-CM | POA: Diagnosis present

## 2021-12-10 DIAGNOSIS — Z79899 Other long term (current) drug therapy: Secondary | ICD-10-CM | POA: Diagnosis not present

## 2021-12-10 DIAGNOSIS — Z87891 Personal history of nicotine dependence: Secondary | ICD-10-CM | POA: Diagnosis not present

## 2021-12-10 DIAGNOSIS — Z7982 Long term (current) use of aspirin: Secondary | ICD-10-CM | POA: Diagnosis not present

## 2021-12-10 DIAGNOSIS — R222 Localized swelling, mass and lump, trunk: Secondary | ICD-10-CM | POA: Diagnosis present

## 2021-12-10 DIAGNOSIS — I2510 Atherosclerotic heart disease of native coronary artery without angina pectoris: Secondary | ICD-10-CM

## 2021-12-10 DIAGNOSIS — E785 Hyperlipidemia, unspecified: Secondary | ICD-10-CM | POA: Diagnosis present

## 2021-12-10 DIAGNOSIS — Z880 Allergy status to penicillin: Secondary | ICD-10-CM

## 2021-12-10 DIAGNOSIS — J939 Pneumothorax, unspecified: Secondary | ICD-10-CM

## 2021-12-10 LAB — PREPARE RBC (CROSSMATCH)

## 2021-12-10 LAB — ABO/RH: ABO/RH(D): A NEG

## 2021-12-10 SURGERY — EXCISION, MASS, MEDIASTINUM, ROBOT-ASSISTED
Anesthesia: General | Site: Chest | Laterality: Right

## 2021-12-10 MED ORDER — VANCOMYCIN HCL IN DEXTROSE 1-5 GM/200ML-% IV SOLN
1000.0000 mg | Freq: Two times a day (BID) | INTRAVENOUS | Status: AC
  Administered 2021-12-10: 1000 mg via INTRAVENOUS
  Filled 2021-12-10: qty 200

## 2021-12-10 MED ORDER — LACTATED RINGERS IV SOLN
INTRAVENOUS | Status: DC
  Administered 2021-12-10: 10 mL via INTRAVENOUS

## 2021-12-10 MED ORDER — SODIUM CHLORIDE FLUSH 0.9 % IV SOLN
INTRAVENOUS | Status: DC | PRN
  Administered 2021-12-10: 30 mL

## 2021-12-10 MED ORDER — ACETAMINOPHEN 160 MG/5ML PO SOLN: 1000.0000 mg | Freq: Four times a day (QID) | ORAL | Status: DC

## 2021-12-10 MED ORDER — DEXAMETHASONE SODIUM PHOSPHATE 10 MG/ML IJ SOLN
INTRAMUSCULAR | Status: DC | PRN
  Administered 2021-12-10: 10 mg via INTRAVENOUS

## 2021-12-10 MED ORDER — MIDAZOLAM HCL 2 MG/2ML IJ SOLN
INTRAMUSCULAR | Status: AC
  Filled 2021-12-10: qty 2

## 2021-12-10 MED ORDER — KETOROLAC TROMETHAMINE 30 MG/ML IJ SOLN
INTRAMUSCULAR | Status: AC
  Filled 2021-12-10: qty 1

## 2021-12-10 MED ORDER — ACETAMINOPHEN 10 MG/ML IV SOLN
INTRAVENOUS | Status: AC
  Filled 2021-12-10: qty 100

## 2021-12-10 MED ORDER — FENTANYL CITRATE (PF) 100 MCG/2ML IJ SOLN
INTRAMUSCULAR | Status: AC
  Filled 2021-12-10: qty 2

## 2021-12-10 MED ORDER — PROPOFOL 10 MG/ML IV BOLUS
INTRAVENOUS | Status: AC
  Filled 2021-12-10: qty 20

## 2021-12-10 MED ORDER — TRAMADOL HCL 50 MG PO TABS
50.0000 mg | ORAL_TABLET | Freq: Four times a day (QID) | ORAL | Status: DC | PRN
  Administered 2021-12-11: 50 mg via ORAL
  Filled 2021-12-10: qty 1

## 2021-12-10 MED ORDER — FENTANYL CITRATE (PF) 250 MCG/5ML IJ SOLN
INTRAMUSCULAR | Status: DC | PRN
  Administered 2021-12-10: 25 ug via INTRAVENOUS
  Administered 2021-12-10: 50 ug via INTRAVENOUS
  Administered 2021-12-10: 25 ug via INTRAVENOUS
  Administered 2021-12-10: 50 ug via INTRAVENOUS
  Administered 2021-12-10: 100 ug via INTRAVENOUS

## 2021-12-10 MED ORDER — BUPIVACAINE HCL (PF) 0.5 % IJ SOLN
INTRAMUSCULAR | Status: AC
  Filled 2021-12-10: qty 30

## 2021-12-10 MED ORDER — LACTATED RINGERS IV SOLN: INTRAVENOUS | Status: DC | PRN

## 2021-12-10 MED ORDER — FENTANYL CITRATE PF 50 MCG/ML IJ SOSY: 25.0000 ug | PREFILLED_SYRINGE | INTRAMUSCULAR | Status: DC | PRN

## 2021-12-10 MED ORDER — CHLORHEXIDINE GLUCONATE 0.12 % MT SOLN: 15.0000 mL | Freq: Once | OROMUCOSAL | Status: AC

## 2021-12-10 MED ORDER — ACETAMINOPHEN 500 MG PO TABS
1000.0000 mg | ORAL_TABLET | Freq: Four times a day (QID) | ORAL | Status: DC
  Administered 2021-12-10 – 2021-12-11 (×4): 1000 mg via ORAL
  Filled 2021-12-10 (×5): qty 2

## 2021-12-10 MED ORDER — ACETAMINOPHEN 10 MG/ML IV SOLN: 1000.0000 mg | Freq: Once | INTRAVENOUS | Status: DC | PRN

## 2021-12-10 MED ORDER — ASPIRIN EC 81 MG PO TBEC
81.0000 mg | DELAYED_RELEASE_TABLET | Freq: Every morning | ORAL | Status: DC
  Administered 2021-12-11: 81 mg via ORAL
  Filled 2021-12-10: qty 1

## 2021-12-10 MED ORDER — FENTANYL CITRATE (PF) 250 MCG/5ML IJ SOLN
INTRAMUSCULAR | Status: AC
  Filled 2021-12-10: qty 5

## 2021-12-10 MED ORDER — KETOROLAC TROMETHAMINE 15 MG/ML IJ SOLN
15.0000 mg | Freq: Four times a day (QID) | INTRAMUSCULAR | Status: DC
  Administered 2021-12-10 – 2021-12-11 (×5): 15 mg via INTRAVENOUS
  Filled 2021-12-10 (×5): qty 1

## 2021-12-10 MED ORDER — ASCORBIC ACID 500 MG PO TABS
1000.0000 mg | ORAL_TABLET | Freq: Every day | ORAL | Status: DC
  Administered 2021-12-10: 1000 mg via ORAL
  Filled 2021-12-10: qty 2

## 2021-12-10 MED ORDER — ACETAMINOPHEN 160 MG/5ML PO SOLN: 1000.0000 mg | Freq: Once | ORAL | Status: DC | PRN

## 2021-12-10 MED ORDER — CITALOPRAM HYDROBROMIDE 20 MG PO TABS
10.0000 mg | ORAL_TABLET | Freq: Every morning | ORAL | Status: DC
  Administered 2021-12-11: 10 mg via ORAL
  Filled 2021-12-10: qty 1

## 2021-12-10 MED ORDER — ASPIRIN EC 81 MG PO TBEC: 81.0000 mg | DELAYED_RELEASE_TABLET | Freq: Every morning | ORAL | Status: AC

## 2021-12-10 MED ORDER — OXYCODONE HCL 5 MG/5ML PO SOLN: 5.0000 mg | Freq: Once | ORAL | Status: DC | PRN

## 2021-12-10 MED ORDER — PHENYLEPHRINE 40 MCG/ML (10ML) SYRINGE FOR IV PUSH (FOR BLOOD PRESSURE SUPPORT)
PREFILLED_SYRINGE | INTRAVENOUS | Status: DC | PRN
  Administered 2021-12-10: 80 ug via INTRAVENOUS
  Administered 2021-12-10: 40 ug via INTRAVENOUS

## 2021-12-10 MED ORDER — SENNOSIDES-DOCUSATE SODIUM 8.6-50 MG PO TABS
1.0000 | ORAL_TABLET | Freq: Every day | ORAL | Status: DC
  Administered 2021-12-10: 1 via ORAL
  Filled 2021-12-10: qty 1

## 2021-12-10 MED ORDER — ROSUVASTATIN CALCIUM 10 MG PO TABS: 10.0000 mg | ORAL_TABLET | Freq: Every morning | ORAL | Status: DC

## 2021-12-10 MED ORDER — ROCURONIUM BROMIDE 10 MG/ML (PF) SYRINGE
PREFILLED_SYRINGE | INTRAVENOUS | Status: AC
  Filled 2021-12-10: qty 10

## 2021-12-10 MED ORDER — PROPOFOL 10 MG/ML IV BOLUS
INTRAVENOUS | Status: DC | PRN
  Administered 2021-12-10: 110 mg via INTRAVENOUS
  Administered 2021-12-10: 90 mg via INTRAVENOUS

## 2021-12-10 MED ORDER — ZOLPIDEM TARTRATE 5 MG PO TABS: 5.0000 mg | ORAL_TABLET | Freq: Every evening | ORAL | Status: DC | PRN

## 2021-12-10 MED ORDER — ORAL CARE MOUTH RINSE: 15.0000 mL | Freq: Once | OROMUCOSAL | Status: AC

## 2021-12-10 MED ORDER — ONDANSETRON HCL 4 MG/2ML IJ SOLN
INTRAMUSCULAR | Status: AC
  Filled 2021-12-10: qty 2

## 2021-12-10 MED ORDER — SODIUM CHLORIDE 0.9% IV SOLUTION: Freq: Once | INTRAVENOUS | Status: DC

## 2021-12-10 MED ORDER — BUPIVACAINE LIPOSOME 1.3 % IJ SUSP
INTRAMUSCULAR | Status: AC
  Filled 2021-12-10: qty 20

## 2021-12-10 MED ORDER — ACETAMINOPHEN 10 MG/ML IV SOLN
INTRAVENOUS | Status: DC | PRN
  Administered 2021-12-10: 1000 mg via INTRAVENOUS

## 2021-12-10 MED ORDER — 0.9 % SODIUM CHLORIDE (POUR BTL) OPTIME
TOPICAL | Status: DC | PRN
  Administered 2021-12-10: 2000 mL

## 2021-12-10 MED ORDER — OXYCODONE HCL 5 MG PO TABS: 5.0000 mg | ORAL_TABLET | Freq: Once | ORAL | Status: DC | PRN

## 2021-12-10 MED ORDER — KETOROLAC TROMETHAMINE 30 MG/ML IJ SOLN
INTRAMUSCULAR | Status: DC | PRN
  Administered 2021-12-10: 30 mg via INTRAVENOUS

## 2021-12-10 MED ORDER — SUGAMMADEX SODIUM 200 MG/2ML IV SOLN
INTRAVENOUS | Status: DC | PRN
  Administered 2021-12-10: 200 mg via INTRAVENOUS

## 2021-12-10 MED ORDER — ENOXAPARIN SODIUM 40 MG/0.4ML IJ SOSY
40.0000 mg | PREFILLED_SYRINGE | Freq: Every day | INTRAMUSCULAR | Status: DC
  Administered 2021-12-10: 40 mg via SUBCUTANEOUS
  Filled 2021-12-10: qty 0.4

## 2021-12-10 MED ORDER — ALPRAZOLAM 0.25 MG PO TABS: 0.1250 mg | ORAL_TABLET | Freq: Every evening | ORAL | Status: DC | PRN

## 2021-12-10 MED ORDER — MIDAZOLAM HCL 2 MG/2ML IJ SOLN
INTRAMUSCULAR | Status: DC | PRN
  Administered 2021-12-10 (×2): 1 mg via INTRAVENOUS

## 2021-12-10 MED ORDER — CHLORHEXIDINE GLUCONATE 0.12 % MT SOLN
OROMUCOSAL | Status: AC
  Administered 2021-12-10: 15 mL via OROMUCOSAL
  Filled 2021-12-10: qty 15

## 2021-12-10 MED ORDER — VANCOMYCIN HCL IN DEXTROSE 1-5 GM/200ML-% IV SOLN: 1000.0000 mg | INTRAVENOUS | Status: AC

## 2021-12-10 MED ORDER — PSEUDOEPHEDRINE HCL ER 120 MG PO TB12
120.0000 mg | ORAL_TABLET | Freq: Two times a day (BID) | ORAL | Status: DC
  Filled 2021-12-10 (×3): qty 1

## 2021-12-10 MED ORDER — SODIUM CHLORIDE 0.9 % IV SOLN: INTRAVENOUS | Status: DC

## 2021-12-10 MED ORDER — LIFITEGRAST 5 % OP SOLN: 1.0000 [drp] | Freq: Two times a day (BID) | OPHTHALMIC | Status: DC

## 2021-12-10 MED ORDER — FENTANYL CITRATE (PF) 100 MCG/2ML IJ SOLN
25.0000 ug | INTRAMUSCULAR | Status: DC | PRN
  Administered 2021-12-10 (×2): 25 ug via INTRAVENOUS

## 2021-12-10 MED ORDER — LORATADINE 10 MG PO TABS
10.0000 mg | ORAL_TABLET | Freq: Every day | ORAL | Status: DC
  Administered 2021-12-11: 10 mg via ORAL
  Filled 2021-12-10: qty 1

## 2021-12-10 MED ORDER — ROSUVASTATIN CALCIUM 5 MG PO TABS
10.0000 mg | ORAL_TABLET | Freq: Every morning | ORAL | Status: DC
  Administered 2021-12-11: 10 mg via ORAL
  Filled 2021-12-10: qty 2

## 2021-12-10 MED ORDER — VANCOMYCIN HCL IN DEXTROSE 1-5 GM/200ML-% IV SOLN
INTRAVENOUS | Status: AC
  Administered 2021-12-10: 1000 mg via INTRAVENOUS
  Filled 2021-12-10: qty 200

## 2021-12-10 MED ORDER — ONDANSETRON HCL 4 MG/2ML IJ SOLN
INTRAMUSCULAR | Status: DC | PRN
  Administered 2021-12-10: 4 mg via INTRAVENOUS

## 2021-12-10 MED ORDER — BISACODYL 5 MG PO TBEC
10.0000 mg | DELAYED_RELEASE_TABLET | Freq: Every day | ORAL | Status: DC
  Administered 2021-12-11: 10 mg via ORAL
  Filled 2021-12-10 (×4): qty 2

## 2021-12-10 MED ORDER — ACETAMINOPHEN 500 MG PO TABS: 1000.0000 mg | ORAL_TABLET | Freq: Once | ORAL | Status: DC | PRN

## 2021-12-10 MED ORDER — SODIUM CHLORIDE 0.9 % IV SOLN
INTRAVENOUS | Status: AC | PRN
  Administered 2021-12-10: 1000 mL via INTRAMUSCULAR

## 2021-12-10 MED ORDER — DEXAMETHASONE SODIUM PHOSPHATE 10 MG/ML IJ SOLN
INTRAMUSCULAR | Status: AC
  Filled 2021-12-10: qty 1

## 2021-12-10 MED ORDER — LIDOCAINE 2% (20 MG/ML) 5 ML SYRINGE
INTRAMUSCULAR | Status: AC
  Filled 2021-12-10: qty 5

## 2021-12-10 MED ORDER — VITAMIN D 25 MCG (1000 UNIT) PO TABS
1000.0000 [IU] | ORAL_TABLET | Freq: Every day | ORAL | Status: DC
  Administered 2021-12-10: 1000 [IU] via ORAL
  Filled 2021-12-10: qty 1

## 2021-12-10 MED ORDER — ROCURONIUM BROMIDE 10 MG/ML (PF) SYRINGE
PREFILLED_SYRINGE | INTRAVENOUS | Status: DC | PRN
Start: 2021-12-10 — End: 2021-12-10
  Administered 2021-12-10: 80 mg via INTRAVENOUS
  Administered 2021-12-10 (×2): 30 mg via INTRAVENOUS
  Administered 2021-12-10: 20 mg via INTRAVENOUS

## 2021-12-10 MED ORDER — PHENYLEPHRINE HCL-NACL 20-0.9 MG/250ML-% IV SOLN
INTRAVENOUS | Status: DC | PRN
  Administered 2021-12-10: 15 ug/min via INTRAVENOUS

## 2021-12-10 MED ORDER — ONDANSETRON HCL 4 MG/2ML IJ SOLN: 4.0000 mg | Freq: Four times a day (QID) | INTRAMUSCULAR | Status: DC | PRN

## 2021-12-10 SURGICAL SUPPLY — 74 items
ADH SKN CLS LQ APL DERMABOND (GAUZE/BANDAGES/DRESSINGS) ×1
BAG SPEC RTRVL C125 8X14 (MISCELLANEOUS) ×1
BLADE STERNUM SYSTEM 6 (BLADE) IMPLANT
CANNULA REDUC XI 12-8 STAPL (CANNULA) ×2
CANNULA REDUCER 12-8 DVNC XI (CANNULA) ×1 IMPLANT
CATH THORACIC 36FR (CATHETERS) IMPLANT
CATH THORACIC 36FR RT ANG (CATHETERS) IMPLANT
COVER TIP SHEARS 8 DVNC (MISCELLANEOUS) IMPLANT
COVER TIP SHEARS 8MM DA VINCI (MISCELLANEOUS)
DEFOGGER SCOPE WARMER CLEARIFY (MISCELLANEOUS) ×2 IMPLANT
DERMABOND ADHESIVE PROPEN (GAUZE/BANDAGES/DRESSINGS) ×1
DERMABOND ADVANCED .7 DNX6 (GAUZE/BANDAGES/DRESSINGS) IMPLANT
DRAIN CHANNEL 19F RND (DRAIN) ×1 IMPLANT
DRAIN CONNECTOR BLAKE 1:1 (MISCELLANEOUS) ×1 IMPLANT
DRAPE ARM DVNC X/XI (DISPOSABLE) ×4 IMPLANT
DRAPE COLUMN DVNC XI (DISPOSABLE) ×1 IMPLANT
DRAPE CV SPLIT W-CLR ANES SCRN (DRAPES) ×2 IMPLANT
DRAPE DA VINCI XI ARM (DISPOSABLE) ×8
DRAPE DA VINCI XI COLUMN (DISPOSABLE) ×2
DRAPE HALF SHEET 40X57 (DRAPES) ×4 IMPLANT
DRAPE ORTHO SPLIT 77X108 STRL (DRAPES) ×2
DRAPE SURG ORHT 6 SPLT 77X108 (DRAPES) ×1 IMPLANT
DRSG AQUACEL AG ADV 3.5X14 (GAUZE/BANDAGES/DRESSINGS) IMPLANT
ELECT REM PT RETURN 9FT ADLT (ELECTROSURGICAL)
ELECTRODE REM PT RTRN 9FT ADLT (ELECTROSURGICAL) IMPLANT
FELT TEFLON 1X6 (MISCELLANEOUS) IMPLANT
GAUZE 4X4 16PLY ~~LOC~~+RFID DBL (SPONGE) ×1 IMPLANT
GAUZE KITTNER 4X8 (MISCELLANEOUS) ×3 IMPLANT
GAUZE SPONGE 4X4 12PLY STRL (GAUZE/BANDAGES/DRESSINGS) ×1 IMPLANT
GLOVE SURG MICRO LTX SZ7.5 (GLOVE) ×4 IMPLANT
GOWN STRL REUS W/ TWL LRG LVL3 (GOWN DISPOSABLE) ×1 IMPLANT
GOWN STRL REUS W/ TWL XL LVL3 (GOWN DISPOSABLE) ×1 IMPLANT
GOWN STRL REUS W/TWL 2XL LVL3 (GOWN DISPOSABLE) ×2 IMPLANT
GOWN STRL REUS W/TWL LRG LVL3 (GOWN DISPOSABLE) ×2
GOWN STRL REUS W/TWL XL LVL3 (GOWN DISPOSABLE) ×2
HEMOSTAT POWDER SURGIFOAM 1G (HEMOSTASIS) IMPLANT
HEMOSTAT SURGICEL 2X14 (HEMOSTASIS) ×4 IMPLANT
IRRIGATION STRYKERFLOW (MISCELLANEOUS) ×2 IMPLANT
IRRIGATOR STRYKERFLOW (MISCELLANEOUS) ×4
KIT SUCTION CATH 14FR (SUCTIONS) IMPLANT
NDL 25GX 5/8IN NON SAFETY (NEEDLE) ×1 IMPLANT
NDL SPNL 22GX3.5 QUINCKE BK (NEEDLE) ×1 IMPLANT
NEEDLE 25GX 5/8IN NON SAFETY (NEEDLE) ×2 IMPLANT
NEEDLE SPNL 22GX3.5 QUINCKE BK (NEEDLE) ×2 IMPLANT
PACK CHEST (CUSTOM PROCEDURE TRAY) ×2 IMPLANT
PAD ARMBOARD 7.5X6 YLW CONV (MISCELLANEOUS) ×4 IMPLANT
PAD ELECT DEFIB RADIOL ZOLL (MISCELLANEOUS) IMPLANT
SEAL CANN UNIV 5-8 DVNC XI (MISCELLANEOUS) ×3 IMPLANT
SEAL XI 5MM-8MM UNIVERSAL (MISCELLANEOUS) ×6
SEALER SYNCHRO 8 IS4000 DV (MISCELLANEOUS) ×2
SEALER SYNCHRO 8 IS4000 DVNC (MISCELLANEOUS) IMPLANT
SET TRI-LUMEN FLTR TB AIRSEAL (TUBING) ×2 IMPLANT
SPONGE T-LAP 18X18 ~~LOC~~+RFID (SPONGE) ×5 IMPLANT
SPONGE T-LAP 4X18 ~~LOC~~+RFID (SPONGE) ×1 IMPLANT
STAPLER CANNULA SEAL DVNC XI (STAPLE) IMPLANT
STAPLER CANNULA SEAL XI (STAPLE)
SUT BONE WAX W31G (SUTURE) IMPLANT
SUT SILK 2 0 SH CR/8 (SUTURE) IMPLANT
SUT STEEL 6MS V (SUTURE) IMPLANT
SUT STEEL SZ 6 DBL 3X14 BALL (SUTURE) IMPLANT
SUT VIC AB 1 CTX 36 (SUTURE)
SUT VIC AB 1 CTX36XBRD ANBCTR (SUTURE) IMPLANT
SUT VIC AB 2-0 CTX 36 (SUTURE) IMPLANT
SUT VIC AB 3-0 X1 27 (SUTURE) ×4 IMPLANT
SUT VICRYL 0 UR6 27IN ABS (SUTURE) ×4 IMPLANT
SYR 20CC LL (SYRINGE) ×4 IMPLANT
SYSTEM RETRIEVAL ANCHOR 8 (MISCELLANEOUS) ×1 IMPLANT
SYSTEM SAHARA CHEST DRAIN ATS (WOUND CARE) ×1 IMPLANT
TAPE PAPER 3X10 WHT MICROPORE (GAUZE/BANDAGES/DRESSINGS) ×1 IMPLANT
TOWEL GREEN STERILE (TOWEL DISPOSABLE) IMPLANT
TOWEL GREEN STERILE FF (TOWEL DISPOSABLE) IMPLANT
TRAY FOLEY SLVR 14FR TEMP STAT (SET/KITS/TRAYS/PACK) ×1 IMPLANT
TRAY FOLEY SLVR 16FR TEMP STAT (SET/KITS/TRAYS/PACK) IMPLANT
TROCAR PORT AIRSEAL 12X150 (TUBING) ×2 IMPLANT

## 2021-12-10 NOTE — Interval H&P Note (Signed)
History and Physical Interval Note: ? ?12/10/2021 ?7:15 AM ? ?[name]  has presented today for surgery, with the diagnosis of ANTERIOR MEDIASTINAL MASS.  The various methods of treatment have been discussed with the patient and family. After consideration of risks, benefits and other options for treatment, the patient has consented to  Procedure(s): ?XI ROBOTIC ASSISTED RESECTION OF ANTERIOR MEDIASTINAL MASS (Right) as a surgical intervention.  The patient's history has been reviewed, patient examined, no change in status, stable for surgery.  I have reviewed the patient's chart and labs.  Questions were answered to the patient's satisfaction.   ? ? ?[name] ? ? ?

## 2021-12-10 NOTE — Hospital Course (Addendum)
HPI: ?This is a 61 year old woman with a history of reflux, anxiety, and hyperlipidemia.  She has a past history of tobacco use quitting in 2011.  She recently had a CT for coronary calcium scoring.  Her coronary calcium score was 2.28 (61st percentile), minimal plaque in the LAD.  She was also noted to have a 2.3 x 1.6 x 2.4 cm anterior mediastinal mass. ? ?She recently had an episode of palpitations while exercising.  She felt poorly for a couple days afterwards.  That led to her cardiac work-up.  She otherwise has been feeling well.  She denies any double vision or weakness.  She does have reflux and heartburn. ? ?Dr. 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 Potential risks, benefits, and complications of the surgery were discussed with the patient and she agreed to proceed with surgery. ? ?Hospital Course: ?Patient underwent a Xi robotic assisted right VATS, resection of anterior mediastinal mass. She was extubated and transferred from the OR to PACU in stable condition. Chest tube was placed to water seal and there was no air leak. CXR was stable. All wounds are clean, dry, healing without signs of infection. Chest tube was removed on 03/**. Follow up chest x ray showed **. ? ?

## 2021-12-10 NOTE — Anesthesia Procedure Notes (Signed)
Procedure Name: Intubation ?Date/Time: 12/10/2021 7:47 AM ?Performed by: Harden Mo, CRNA ?Pre-anesthesia Checklist: Patient identified, Emergency Drugs available, Suction available and Patient being monitored ?Patient Re-evaluated:Patient Re-evaluated prior to induction ?Oxygen Delivery Method: Circle system utilized ?Preoxygenation: Pre-oxygenation with 100% oxygen ?Induction Type: IV induction ?Ventilation: Mask ventilation without difficulty and Oral airway inserted - appropriate to patient size ?Laryngoscope Size: Sabra Heck and 2 ?Grade View: Grade I ?Endobronchial tube: Double lumen EBT, EBT position confirmed by auscultation and EBT position confirmed by fiberoptic bronchoscope and 35 Fr ?Placement Confirmation: positive ETCO2 and breath sounds checked- equal and bilateral ?Dental Injury: Teeth and Oropharynx as per pre-operative assessment  ? ? ? ? ?

## 2021-12-10 NOTE — Anesthesia Preprocedure Evaluation (Signed)
Anesthesia Evaluation  ?Patient identified by MRN, date of birth, ID band ?Patient awake ? ? ? ?Reviewed: ?Allergy & Precautions, NPO status , Patient's Chart, lab work & pertinent test results ? ?History of Anesthesia Complications ?(+) PONV and history of anesthetic complications ? ?Airway ?Mallampati: III ? ?TM Distance: >3 FB ?Neck ROM: Full ? ? ? Dental ? ?(+) Dental Advisory Given, Teeth Intact ?  ?Pulmonary ?neg shortness of breath, neg sleep apnea, neg COPD, neg recent URI, former smoker,  ?  ?breath sounds clear to auscultation ? ? ? ? ? ? Cardiovascular ?negative cardio ROS ? ? ?Rhythm:Regular  ? ?  ?Neuro/Psych ?PSYCHIATRIC DISORDERS Anxiety negative neurological ROS ?   ? GI/Hepatic ?Neg liver ROS, GERD  Controlled,  ?Endo/Other  ?negative endocrine ROS ? Renal/GU ?negative Renal ROS  ? ?  ?Musculoskeletal ?negative musculoskeletal ROS ?(+)  ? Abdominal ?  ?Peds ? 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 Reproductive/Obstetrics ? ?  ? ? ? ? ? ? ? ? ? ? ? ? ? ?  ?  ? ? ? ? ? ? ? ? ?Anesthesia Physical ?Anesthesia Plan ? ?ASA: 2 ? ?Anesthesia Plan: General  ? ?Post-op Pain Management: Toradol IV (intra-op)* and Ofirmev IV (intra-op)*  ? ?Induction: Intravenous ? ?PONV Risk Score and Plan: 4 or greater and Ondansetron and Dexamethasone ? ?Airway Management Planned: Double Lumen EBT ? ?Additional Equipment: Arterial line, CVP and Ultrasound Guidance Line Placement ? ?Intra-op Plan:  ? ?Post-operative Plan: Extubation in OR ? ?Informed Consent: I have reviewed the patients History and Physical, chart, labs and discussed the procedure including the risks, benefits and alternatives for the proposed anesthesia with the patient or authorized representative who has indicated his/her understanding and acceptance.  ? ? ? ?Dental advisory  given ? ?Plan Discussed with: CRNA and Anesthesiologist ? ?Anesthesia Plan Comments:   ? ? ? ? ? ? ?Anesthesia Quick Evaluation ? ?

## 2021-12-10 NOTE — Discharge Instructions (Signed)
Robot-Assisted Thoracic Surgery, Care After °The following information offers guidance on how to care for yourself after your procedure. Your health care provider may also give you more specific instructions. If you have problems or questions, contact your health care provider. °What can I expect after the procedure? °After the procedure, it is common to have: °Some pain and aches in the area of your surgical incisions. °Pain when breathing in (inhaling) and coughing. °Tiredness (fatigue). °Trouble sleeping. °Constipation. °Follow these instructions at home: °Medicines °Take over-the-counter and prescription medicines only as told by your health care provider. °If you were prescribed an antibiotic medicine, take it as told by your health care provider. Do not stop taking the antibiotic even if you start to feel better. °Talk with your health care provider about safe and effective ways to manage pain after your procedure. Pain management should fit your specific health needs. °Take pain medicine before pain becomes severe. Relieving and controlling your pain will make breathing easier for you. °Ask your health care provider if the medicine prescribed to you requires you to avoid driving or using machinery. °Eating and drinking °Follow instructions from your health care provider about eating or drinking restrictions. These will vary depending on what procedure you had. Your health care provider may recommend: °A liquid diet or soft diet for the first few days. °Meals that are smaller and more frequent. °A diet of fruits, vegetables, whole grains, and low-fat proteins. °Limiting foods that are high in fat and processed sugar, including fried or sweet foods. °Incision care °Follow instructions from your health care provider about how to take care of your incisions. Make sure you: °Wash your hands with soap and water for at least 20 seconds before and after you change your bandage (dressing). If soap and water are not  available, use hand sanitizer. °Change your dressing as told by your health care provider. °Leave stitches (sutures), skin glue, or adhesive strips in place. These skin closures may need to stay in place for 2 weeks or longer. If adhesive strip edges start to loosen and curl up, you may trim the loose edges. Do not remove adhesive strips completely unless your health care provider tells you to do that. °Check your incision area every day for signs of infection. Check for: °Redness, swelling, or more pain. °Fluid or blood. °Warmth. °Pus or a bad smell. °Activity °Return to your normal activities as told by your health care provider. Ask your health care provider what activities are safe for you. °Ask your health care provider when it is safe for you to drive. °Do not lift anything that is heavier than 10 lb (4.5 kg), or the limit that you are told, until your health care provider says that it is safe. °Rest as told by your health care provider. °Avoid sitting for a long time without moving. Get up to take short walks every 1-2 hours. This is important to improve blood flow and breathing. Ask for help if you feel weak or unsteady. °Do exercises as told by your health care provider. °Pneumonia prevention °Do deep breathing exercises and cough regularly as directed. This helps clear mucus and opens your lungs. Doing this helps prevent lung infection (pneumonia). °If you were given an incentive spirometer, use it as told. An incentive spirometer is a tool that measures how well you are filling your lungs with each breath. °Coughing may hurt less if you try to support your chest. This is called splinting. Try one of these when you cough: °  Hold a pillow against your chest. °Place the palms of both hands on top of your incision area. °Do not use any products that contain nicotine or tobacco. These products include cigarettes, chewing tobacco, and vaping devices, such as e-cigarettes. If you need help quitting, ask your  health care provider. °Avoid secondhand smoke. °General instructions °If you have a drainage tube: °Follow instructions from your health care provider about how to take care of it. °Do not travel by airplane after your tube is removed until your health care provider tells you it is safe. °You may need to take these actions to prevent or treat constipation: °Drink enough fluid to keep your urine pale yellow. °Take over-the-counter or prescription medicines. °Eat foods that are high in fiber, such as beans, whole grains, and fresh fruits and vegetables. °Limit foods that are high in fat and processed sugars, such as fried or sweet foods. °Keep all follow-up visits. This is important. °Contact a health care provider if: °You have redness, swelling, or more pain around an incision. °You have fluid or blood coming from an incision. °An incision feels warm to the touch. °You have pus or a bad smell coming from an incision. °You have a fever. °You cannot eat or drink without vomiting. °Your pain medicine is not controlling your pain. °Get help right away if: °You have chest pain. °Your heart is beating quickly. °You have trouble breathing. °You have trouble speaking. °You are confused. °You feel weak or dizzy, or you faint. °These symptoms may represent a serious problem that is an emergency. Do not wait to see if the symptoms will go away. Get medical help right away. Call your local emergency services (911 in the [place] Do not drive yourself to the hospital. °Summary °Talk with your health care provider about safe and effective ways to manage pain after your procedure. Pain management should fit your specific health needs. °Return to your normal activities as told by your health care provider. Ask your health care provider what activities are safe for you. °Do deep breathing exercises and cough regularly as directed. This helps to clear mucus and prevent pneumonia. If it hurts to cough, ease pain by holding a pillow  against your chest or by placing the palms of both hands over your incisions. °This information is not intended to replace advice given to you by your health care provider. Make sure you discuss any questions you have with your health care provider. °Document Revised: 06/19/2020 Document Reviewed: 06/19/2020 °[employer]vier Patient Education © 2022 [employer] °  °

## 2021-12-10 NOTE — Progress Notes (Addendum)
?  Mobility Specialist Criteria Algorithm Info. ? ? 12/10/21 1845  ?Mobility  ?Activity Transferred from bed to chair  ?Range of Motion/Exercises Active;All extremities  ?Level of Assistance Standby assist, set-up cues, supervision of patient - no hands on  ?Assistive Device None  ?Distance Ambulated (ft) 2 ft  ?Activity Response Tolerated well  ? ?SaO2% pre: 97% ?SaO2% post: 98% ? ?Patient received in bed eager to get OOB. Was independent to EOB and supervision to transfer to recliner. Only required assistance to manage lines/drain. Was left in recliner chair with all needs met, call bell in reach.  ? ?12/10/2021 ?7:49 PM ? ?[place] Sherleen Pangborn, CMS, BS EXP ?Acute Rehabilitation Services  ?FUWTK:[phone] ?Office: [phone] ? ?

## 2021-12-10 NOTE — Op Note (Unsigned)
NAME: [name], [name] MEDICAL RECORD NO: [id number] ACCOUNT NO: [id number] DATE OF BIRTH: 11-11-1960 FACILITY: [facility] LOCATION: [facility] PHYSICIAN: Revonda Standard. Roxan Hockey, MD  Operative Report   DATE OF PROCEDURE: 12/10/2021  PREOPERATIVE DIAGNOSIS:  Anterior mediastinal mass.  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  PROCEDURE:  Xi robotic-assisted right thoracoscopy, thymectomy.  SURGEON:  Revonda Standard. Roxan Hockey, MD  ASSISTANT:  Lars Pinks, PA  ANESTHESIA:  General.  FINDINGS:  Mass clearly visible in fat pad. Frozen section suspicious for thymoma.  CLINICAL NOTE:  The patient is a 61 year old woman who recently had a CT for coronary calcium scoring.  She was found to have an anterior mediastinal mass.  On PET/CT, the mass was hypermetabolic.  She was advised to undergo surgical resection via a  right robotic approach.  The indications, risks, benefits, and alternatives were discussed in detail with the patient.  She understood and accepted the risks and agreed to proceed.  OPERATIVE NOTE:  The patient was brought to the preoperative holding area on 12/10/2021.  Anesthesia placed a central line and an arterial blood pressure monitoring line.  She was taken to the operating room and anesthetized and intubated with a double  lumen endotracheal tube.  Intravenous antibiotics were administered.  A Foley catheter was placed.  Sequential compression devices were placed on the calves for DVT prophylaxis.  She was placed in a supine position and a bump was placed under the right  shoulder blade to elevate the right chest. The chest then was prepped and draped in the usual sterile fashion.  Single lung ventilation of the left lung was initiated and was tolerated well throughout the procedure.  Timeout was performed.  A solution containing 20 mL of liposomal bupivacaine and 30 mL of 0.5% bupivacaine was prepared.  This was used for local at the incision sites.   Incision was made in approximately the fifth interspace laterally.  An 8 mm port was  inserted.  The thoracoscope was advanced into the chest after confirming intrapleural placement.  Carbon dioxide was insufflated per protocol.  Two additional 8 mm robotic ports were placed, one in the third interspace along the anterior axillary line  and then one in the eighth interspace along the anterior axillary line.  A 12 mm AirSeal port was placed centered between the two lower robotic ports and slightly more posteriorly.  Inspection of the mediastinum showed the phrenic nerve was clearly  visible throughout its course. In the fat pad anterior to the phrenic nerve, the mass was clearly visible.  The dissection was begun inferiorly. Bipolar cautery was used for the dissection.  Care was taken to preserve the phrenic nerve and minimal  cautery was used in the vicinity of the phrenic nerve. As the mass was mobilized off the pericardium, it was clear there was no invasion of the pericardium.  The mass was completely encapsulated in the fatty tissue.  Once the entire right side of the  thymus had been mobilized inferiorly, the pleura was scored over the thymic fat pad working anteriorly at the superior extent. The superior poles were gently teased from the neck and the vessels were cauterized.  There were 2 large veins into the thymic  fat pad from the innominate. These were divided with the SynchroSeal device.  The remaining attachments were divided and the thymus was completely resected.  An 8 mm endoscopic extraction bag was placed through the posterior port incision and the  specimen was placed into the bag, which  was then removed and sent for frozen section.  Inspection of the operative site revealed no signs of bleeding.  A 19-French Blake drain was placed through the most inferior robotic port incision and placed into the  chest along the mediastinum.  It was secured to the skin with a #1 silk suture.  Dual lung  ventilation was resumed.  The remaining incisions were closed with 0 Vicryl fascial sutures and 3-0 Vicryl subcuticular sutures.  Dermabond was applied.  Chest  tube was placed to a Pleur-Evac on waterseal.  She was placed back into a supine position.  She was extubated in the operating room and taken to the [facility] in good condition.  All sponge, needle and instrument counts were correct at the  end of the procedure.    Experienced assistance was necessary for this case due to surgical complexity. Lars Pinks assisted with port placement, docking of the robot, instrument exchange, suctioning and exposure and wound closure.     PAA D: 12/10/2021 5:00:03 pm T: 12/10/2021 11:56:00 pm  JOB: [id number]/ [id number]

## 2021-12-10 NOTE — Brief Op Note (Addendum)
12/10/2021 ? ?10:05 AM ? ?PATIENT:  Karan Inclan  61 y.o. female ? ?PRE-OPERATIVE DIAGNOSIS:  Anterior mediastinal mass ? 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 ?PROCEDURE:  Xi ROBOTIC ASSISTED RIGHT VATS, RESECTION of ANTERIOR MEDIASTINAL MASS (likely THYMOMA) ? ?SURGEON:  Surgeon(s) and Role: ?   [name], MD - Primary ? ?PHYSICIAN ASSISTANT: Lars Pinks PA-C ? ?ANESTHESIA:   general ? ?EBL:  10 mL  ? ?BLOOD ADMINISTERED:none ? ?DRAINS:  47 Blake drain placed in the right pleural space   ? ?LOCAL MEDICATIONS USED:  OTHER Exparel ? ?SPECIMEN:  Source of Specimen:  Anterior mediastinal mass ? ?DISPOSITION OF SPECIMEN:  PATHOLOGY ? ?COUNTS CORRECT:  YES ? ?DICTATION: .Dragon Dictation ? ?PLAN OF CARE: Admit to inpatient  ? ?PATIENT DISPOSITION:  PACU - hemodynamically stable. ?  ?Delay start of Pharmacological VTE agent (>24hrs) due to surgical blood loss or risk of bleeding: no ? ?

## 2021-12-10 NOTE — Discharge Summary (Cosign Needed)
Physician Discharge Summary       [address]       [place] [address]             [phone]    Patient ID: Aariana Shankland MRN: [id number] DOB/AGE: [birth year] 61 y.o.  Admit date: 12/10/2021 Discharge date: 12/11/2021  Admission Diagnoses: Anterior mediastinal mass  Discharge Diagnoses:  S/p Xi robotic assisted right VATS History of GERD (gastroesophageal reflux disease) History of colon polyps History of anxiety History of PONV (postoperative nausea and vomiting) History of remote tobacco abuse   Consults: None  Procedure (s):   Operative Report    DATE OF PROCEDURE: 12/10/2021   PREOPERATIVE DIAGNOSIS:  Anterior mediastinal mass.   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   PROCEDURE:  Xi robotic-assisted right thoracoscopy, thymectomy.   SURGEON:  Revonda Standard. Roxan Hockey, MD   ASSISTANT:  Lars Pinks, PA   ANESTHESIA:  General.   FINDINGS:  Mass clearly visible in fat pad. Frozen section suspicious for thymoma.   CLINICAL NOTE:  The patient is a 61 year old woman who recently had a CT for coronary calcium scoring.  She was found to have an anterior mediastinal mass.  On PET/CT, the mass was hypermetabolic.  She was advised to undergo surgical resection via a  right robotic approach.  The indications, risks, benefits, and alternatives were discussed in detail with the patient.  She understood and accepted the risks and agreed to proceed.    Pathology:Final result is pending  HPI: This is a 61 year old woman with a history of reflux, anxiety, and hyperlipidemia.  She has a past history of tobacco use quitting in 2011.  She recently had a CT for coronary calcium scoring.  Her coronary calcium score was 2.28 (61st percentile), minimal plaque in the LAD.  She was also noted to have a 2.3 x 1.6 x 2.4 cm anterior mediastinal mass.  She recently had an episode of palpitations while exercising.  She felt  poorly for a couple days afterwards.  That led to her cardiac work-up.  She otherwise has been feeling well.  She denies any double vision or weakness.  She does have reflux and heartburn.  Dr. 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 Potential risks, benefits, and complications of the surgery were discussed with the patient and she agreed to proceed with surgery.  Hospital Course: Patient underwent a Xi robotic assisted right VATS, resection of anterior mediastinal mass. She was extubated and transferred from the OR to PACU in stable condition. Chest tube was placed to water seal and there was no air leak. CXR was stable. All wounds are clean, dry, healing without signs of infection. Chest tube was removed on post-op day 1. Diet was advanced and well tolerated and she was able to resume ambulation without difficulty. The incisions were intact and dry.     Latest Vital Signs: Blood pressure 115/77, pulse 66, temperature 98.1 F (36.7 C), temperature source Oral, resp. rate 15, height 5\' 6"  (1.676 m), weight 75 kg, SpO2 97 %.  Physical Exam:  General appearance: alert, cooperative, and no distress Chest: clear breath sounds, O2 sats adequate on RA Wounds: intact and dry  Discharge Condition: Discharged to home in stable condition  Recent laboratory studies:  Lab Results  Component Value Date   WBC 8.3 12/11/2021   HGB 10.9 (L) 12/11/2021   HCT 34.8 (L) 12/11/2021   MCV  98.6 12/11/2021   PLT 172 12/11/2021   Lab Results  Component Value Date   NA 138 12/11/2021   K 4.2 12/11/2021   CL 108 12/11/2021   CO2 25 12/11/2021   CREATININE 0.67 12/11/2021   GLUCOSE 117 (H) 12/11/2021      Diagnostic Studies: DG Chest 2 View  Result Date: 12/09/2021 CLINICAL DATA:  Preop chest radiograph.  Mediastinal mass. EXAM: CHEST - 2 VIEW COMPARISON:  Chest radiograph dated 10/25/2021. FINDINGS: No focal  consolidation, pleural effusion, or pneumothorax. The cardiac silhouette is within limits. The aorta is tortuous. No acute osseous pathology. IMPRESSION: No active cardiopulmonary disease. Electronically Signed   By: Anner Crete M.D.   On: 12/09/2021 19:57   NM PET Image Initial (PI) Skull Base To Thigh  Result Date: 12/07/2021 CLINICAL DATA:  Initial treatment strategy for anterior mediastinal mass. EXAM: NUCLEAR MEDICINE PET SKULL BASE TO THIGH TECHNIQUE: 7.9 mCi F-18 FDG was injected intravenously. Full-ring PET imaging was performed from the skull base to thigh after the radiotracer. CT data was obtained and used for attenuation correction and anatomic localization. Fasting blood glucose: 102 mg/dl COMPARISON:  11/23/2021 FINDINGS: Mediastinal blood pool activity: SUV max 2.63 Liver activity: SUV max NA NECK: No hypermetabolic lymph nodes in the neck. Incidental CT findings: None CHEST: The anterior mediastinal mass adjacent to the ascending thoracic aorta measures 2.1 x 1.5 cm and has an SUV max of 4.41, image 68/4. No FDG avid axillary, supraclavicular, or hilar lymph nodes. There is a small hiatal hernia. Just above the hiatal hernia there is a small focus of mild uptake within the distal esophagus with SUV max of 4.57. No tracer avid pulmonary nodule or mass. Incidental CT findings: Scattered calcified granulomas identified within the lungs. Aortic atherosclerosis. ABDOMEN/PELVIS: No abnormal hypermetabolic activity within the liver, pancreas, adrenal glands, or spleen. No hypermetabolic lymph nodes in the abdomen or pelvis. Incidental CT findings: Sigmoid diverticulosis. Aortic atherosclerosis. Small hiatal hernia. SKELETON: No focal hypermetabolic activity to suggest skeletal metastasis. Incidental CT findings: Lumbar spondylosis. Anterolisthesis of L4 on L5 is noted. IMPRESSION: 1. The anterior mediastinal mass adjacent to the ascending thoracic aorta is FDG avid within SUV max of 4.41.  Differential considerations include benign and malignant etiologies. Most common primary neoplasm of the anterior mediastinum would be a thymoma. 2. No signs of nodal metastasis or distant metastatic disease. 3. There is a small hiatal hernia. Just above the hernia is a short segment of increased uptake within the distal esophagus which may represents esophagitis. Consider correlation with direct visualization. 4.  Aortic Atherosclerosis (ICD10-I70.0). Electronically Signed   By: Kerby Moors M.D.   On: 12/07/2021 09:49   CT CORONARY MORPH W/CTA COR W/SCORE W/CA W/CM &/OR WO/CM  Addendum Date: 11/23/2021   ADDENDUM REPORT: 11/23/2021 12:45 CLINICAL DATA:  67F with chest pain and family history of heart disease. EXAM: Cardiac/Coronary  CT TECHNIQUE: The patient was scanned on a [employer]. FINDINGS: A 120 kV prospective scan was triggered in the descending thoracic aorta at 111 HU's. Axial non-contrast 3 mm slices were carried out through the heart. The data set was analyzed on a dedicated work station and scored using the [place]. Gantry rotation speed was 250 msecs and collimation was .6 mm. No beta blockade and 0.8 mg of sl NTG was given. The 3D data set was reconstructed in 5% intervals of the 67-82 % of the R-R cycle. Diastolic phases were analyzed on a dedicated work station using MPR, MIP  and VRT modes. The patient received 80 cc of contrast. Aorta: Normal size. Ascending aorta 3.1 cm. No calcifications. No dissection. Aortic Valve:  Trileaflet.  No calcifications. Coronary Arteries:  Normal coronary origin.  Right dominance. RCA is a large co-dominant artery that gives rise to PDA and PLVB. There is no plaque. Left main is a large artery that gives rise to LAD and LCX arteries. LAD is a large vessel that has minimal (<25%) calcified plaque in the proximal LAD. LCX is a co-dominant artery that gives rise to one large OM1 branch. There is no plaque. Coronary Calcium Score: Left main: 0  Left anterior descending artery: 2.28 Left circumflex artery: 0 Right coronary artery: 0 Total: 2.28 Percentile: 61st Other findings: Normal pulmonary vein drainage into the left atrium. Normal let atrial appendage without a thrombus. Normal size of the pulmonary artery. Slab reconstruction artifact. IMPRESSION: 1. Coronary calcium score of 2.28. This was 61st percentile for age-, race-, and sex-matched controls. 2. Normal coronary origin with right dominance. 3. Minimal (<25%) calcified plaque in the proximal LAD. CAD-RADS 1. 4.  Recommend aggressive risk factor modification. 5.  Consider non-ischemic causes of chest pain. Skeet Latch Electronically Signed   By: Skeet Latch M.D.   On: 11/23/2021 12:45   Result Date: 11/23/2021 EXAM: OVER-READ INTERPRETATION  CT CHEST The following report is an over-read performed by radiologist Dr. Aletta Edouard of [facility], [place] on 11/23/2021. This over-read does not include interpretation of cardiac or coronary anatomy or pathology. The coronary CTA interpretation by the cardiologist is attached. COMPARISON:  CT of the chest on 12/26/2013 FINDINGS: Vascular: No significant noncardiac vascular findings. Mediastinum/Nodes: Visualized mediastinum and hilar regions demonstrate an oval-shaped anterior mediastinal mass just anterior to the ascending thoracic aorta on axial images 3-14 not present on the prior chest CT. This soft tissue measures approximately 2.3 x 1.6 x 2.4 cm. Statistically this most likely represents an enlarged lymph node. In the anterior mediastinum, thymoma is also in the differential. No other lymphadenopathy visualized. However, the entire mediastinum and hilar regions are not imaged on the current study. There is a stable small hiatal hernia. Lungs/Pleura: Stable calcified left upper lobe granuloma. Visualized lungs show no evidence of pulmonary edema, consolidation, pneumothorax or pleural fluid. Upper Abdomen: No acute abnormality.  Musculoskeletal: No chest wall mass or suspicious bone lesions identified. IMPRESSION: Soft tissue mass in the visualized anterior mediastinum just to the right of midline and anterior to the ascending thoracic aorta with estimated dimensions of approximately 2.3 x 1.6 x 2.4 cm. This statistically likely represents an enlarged lymph node. Initial evaluation is recommended with a CT of the entire chest with IV contrast. Correlation also suggested with any palpable lymphadenopathy in other areas of the body by physical examination which may lead to a need to image additional parts of the body. Electronically Signed: By: Aletta Edouard M.D. On: 11/23/2021 11:05   DG Chest Port 1 View  Result Date: 12/11/2021 CLINICAL DATA:  61 year old female with history of pneumothorax. EXAM: PORTABLE CHEST 1 VIEW COMPARISON:  Chest x-ray 12/10/2021. FINDINGS: Right-sided chest tube is stable in position with tip in the lateral aspect of the mid right hemithorax. Right internal jugular central venous catheter with tip terminating at the superior cavoatrial junction. No appreciable residual right-sided pneumothorax is noted. Lung volumes are low. No consolidative airspace disease. No pleural effusions. No pulmonary nodule or mass noted. Pulmonary vasculature and the cardiomediastinal silhouette are within normal limits. Atherosclerotic calcifications are noted in the  thoracic aorta. IMPRESSION: 1. Support apparatus, as above. 2. Low lung volumes without definite residual right-sided pneumothorax. 3. Aortic atherosclerosis. Electronically Signed   By: Vinnie Langton M.D.   On: 12/11/2021 06:09   DG Chest Port 1 View  Result Date: 12/10/2021 CLINICAL DATA:  Pneumothorax. EXAM: PORTABLE CHEST 1 VIEW COMPARISON:  12/08/2021 FINDINGS: Interval right chest tube. Tiny right upper pneumothorax occupying approximately 2% of the volume of the right hemithorax. Clear right lung. Interval linear atelectasis in the left mid lung zone. The  cardiac silhouette currently appears mildly enlarged with a mildly tortuous thoracic aorta. Mild-to-moderate peribronchial thickening with progression. No acute bony abnormality. IMPRESSION: 1. Approximately 2% right pneumothorax following right chest tube placement. 2. Interval linear atelectasis in the left mid lung zone. 3. Mild to moderate bronchitic changes with mild progression. Electronically Signed   By: Claudie Revering M.D.   On: 12/10/2021 11:32      Discharge Medications: Allergies as of 12/11/2021       Reactions   Cantaloupe Extract Allergy Skin Test Anaphylaxis   Watermelon Flavor Anaphylaxis   Codeine Nausea And Vomiting   Penicillins Rash        Medication List     STOP taking these medications    metoprolol tartrate 25 MG tablet Commonly known as: LOPRESSOR       TAKE these medications    ALPRAZolam 0.25 MG tablet Commonly known as: XANAX Take 0.125 mg by mouth at bedtime as needed for anxiety or sleep.   aspirin EC 81 MG tablet Take 1 tablet (81 mg total) by mouth every morning. Swallow whole.   cetirizine 10 MG tablet Commonly known as: ZYRTEC Take 10 mg by mouth every morning.   Cholecalciferol 25 MCG (1000 UT) tablet Take 1,000 Units by mouth at bedtime.   citalopram 20 MG tablet Commonly known as: CELEXA Take 10 mg by mouth every morning.   ibuprofen 200 MG tablet Commonly known as: ADVIL Take 600 mg by mouth every 6 (six) hours as needed (sinus headache).   rosuvastatin 10 MG tablet Commonly known as: CRESTOR Take 1 tablet (10 mg total) by mouth every morning.   traMADol 50 MG tablet Commonly known as: ULTRAM Take 1-2 tablets (50-100 mg total) by mouth every 6 (six) hours as needed for up to 7 days for moderate pain (mild pain).   TURMERIC PO Take 1,000 mg by mouth at bedtime.   vitamin C 1000 MG tablet Take 1,000 mg by mouth at bedtime.   Xiidra 5 % Soln Generic drug: Lifitegrast Place 1 drop into both eyes 2 (two) times daily.    ZINC PO Take 1 tablet by mouth at bedtime.   zolpidem 10 MG tablet Commonly known as: AMBIEN Take 10 mg by mouth at bedtime as needed for sleep.        Follow Up Appointments:  Follow-up Information     [name], MD. Go on 12/29/2021.   Specialty: Cardiothoracic Surgery Why: PA/LAT CXR (to be taken at [facility] which is in the same building as Dr. Mckinley [facility] office) on 03/22 at 3:30 pm;Appointment time is at 4:00 pm Contact information: [address] [place] [address] [phone]                 Signed: Joline Maxcy 12/11/2021, 11:45 AM

## 2021-12-10 NOTE — Transfer of Care (Signed)
Immediate Anesthesia Transfer of Care Note ? ?Patient: Triston Lisanti ? ?Procedure(s) Performed: XI ROBOTIC ASSISTED RESECTION OF ANTERIOR MEDIASTINAL MASS - THYMECTOMY (Right: Chest) ? ?Patient Location: PACU ? ?Anesthesia Type:General ? ?Level of Consciousness: awake, alert  and oriented ? ?Airway & Oxygen Therapy: Patient Spontanous Breathing and Patient connected to nasal cannula oxygen ? ?Post-op Assessment: Report given to RN, Post -op Vital signs reviewed and stable and Patient moving all extremities X 4 ? ?Post vital signs: Reviewed and stable ? ?Last Vitals:  ?Vitals Value Taken Time  ?BP 125/76 12/10/21 1024  ?Temp    ?Pulse 69 12/10/21 1026  ?Resp 13 12/10/21 1026  ?SpO2 94 % 12/10/21 1026  ?Vitals shown include unvalidated device data. ? ?Last Pain:  ?Vitals:  ? 12/10/21 0606  ?TempSrc:   ?PainSc: 0-No pain  ?   ? ?  ? ?Complications: No notable events documented. ?

## 2021-12-10 NOTE — Anesthesia Procedure Notes (Addendum)
Arterial Line Insertion ?Start/End3/12/2021 6:55 AM, 12/10/2021 7:05 AM ?Performed by: Harden Mo, CRNA, CRNA ? Patient location: Pre-op. ?Preanesthetic checklist: patient identified, IV checked, site marked, risks and benefits discussed, surgical consent, monitors and equipment checked, pre-op evaluation and anesthesia consent ?Lidocaine 1% used for infiltration ?Left, radial was placed ?Catheter size: 20 G ?Hand hygiene performed  and maximum sterile barriers used  ? ?Attempts: 1 ?Procedure performed without using ultrasound guided technique. ?Ultrasound Notes:anatomy identified, needle tip was noted to be adjacent to the nerve/plexus identified and no ultrasound evidence of intravascular and/or intraneural injection ?Following insertion, dressing applied and Biopatch. ?Post procedure assessment: normal and unchanged ? ?Patient tolerated the procedure well with no immediate complications. ? ? ?

## 2021-12-10 NOTE — Anesthesia Procedure Notes (Signed)
Central Venous Catheter Insertion ?Performed by: Oleta Mouse, MD, anesthesiologist ?Start/End3/12/2021 7:01 AM, 12/10/2021 7:07 AM ?Patient location: Pre-op. ?Preanesthetic checklist: patient identified, IV checked, site marked, risks and benefits discussed, surgical consent, monitors and equipment checked, pre-op evaluation, timeout performed and anesthesia consent ?Position: Trendelenburg ?Lidocaine 1% used for infiltration and patient sedated ?Hand hygiene performed  and maximum sterile barriers used  ?Catheter size: 8 Fr ?Total catheter length 16. ?Central line was placed.Double lumen ?Procedure performed using ultrasound guided technique. ?Ultrasound Notes:anatomy identified, needle tip was noted to be adjacent to the nerve/plexus identified, no ultrasound evidence of intravascular and/or intraneural injection and image(s) printed for medical record ?Attempts: 1 ?Following insertion, dressing applied, line sutured and Biopatch. ?Post procedure assessment: blood return through all ports and free fluid flow ? ?Patient tolerated the procedure well with no immediate complications. ? ? ? ? ?

## 2021-12-11 ENCOUNTER — Inpatient Hospital Stay (HOSPITAL_COMMUNITY): Payer: 59

## 2021-12-11 LAB — BASIC METABOLIC PANEL
Anion gap: 5 (ref 5–15)
BUN: 12 mg/dL (ref 8–23)
CO2: 25 mmol/L (ref 22–32)
Calcium: 8.5 mg/dL — ABNORMAL LOW (ref 8.9–10.3)
Chloride: 108 mmol/L (ref 98–111)
Creatinine, Ser: 0.67 mg/dL (ref 0.44–1.00)
GFR, Estimated: >60 mL/min (ref >60)
Glucose, Bld: 117 mg/dL — ABNORMAL HIGH (ref 70–99)
Potassium: 4.2 mmol/L (ref 3.5–5.1)
Sodium: 138 mmol/L (ref 135–145)

## 2021-12-11 LAB — CBC
HCT: 34.8 % — ABNORMAL LOW (ref 36.0–46.0)
Hemoglobin: 10.9 g/dL — ABNORMAL LOW (ref 12.0–15.0)
MCH: 30.9 pg (ref 26.0–34.0)
MCHC: 31.3 g/dL (ref 30.0–36.0)
MCV: 98.6 fL (ref 80.0–100.0)
Platelets: 172 10*3/uL (ref 150–400)
RBC: 3.53 MIL/uL — ABNORMAL LOW (ref 3.87–5.11)
RDW: 12.1 % (ref 11.5–15.5)
WBC: 8.3 10*3/uL (ref 4.0–10.5)
nRBC: 0 % (ref 0.0–0.2)

## 2021-12-11 MED ORDER — CHLORHEXIDINE GLUCONATE CLOTH 2 % EX PADS: 6.0000 | MEDICATED_PAD | Freq: Every day | CUTANEOUS | Status: DC

## 2021-12-11 MED ORDER — TRAMADOL HCL 50 MG PO TABS: 50.0000 mg | ORAL_TABLET | Freq: Four times a day (QID) | ORAL | 0 refills | Status: AC | PRN

## 2021-12-11 NOTE — Progress Notes (Signed)
?  Transition of Care (TOC) Screening Note ? ? ?Patient Details  ?Name: [name] ?Date of Birth: 1961-05-08 ? ? ?Transition of Care (TOC) CM/SW Contact:    ?Bary Castilla, LCSW ?Phone Number: ?12/11/2021, 11:46 AM ? ? ? ?Transition of Care Department [facility]) has reviewed patient and no TOC needs have been identified at this time. We will continue to monitor patient advancement through interdisciplinary progression rounds. If new patient transition needs arise, please place a TOC consult. ?  ?

## 2021-12-11 NOTE — Anesthesia Postprocedure Evaluation (Signed)
Anesthesia Post Note ? ?Patient: [name] ? ?Procedure(s) Performed: XI ROBOTIC ASSISTED RESECTION OF ANTERIOR MEDIASTINAL MASS - THYMECTOMY (Right: Chest) ? ?  ? ?Patient location during evaluation: PACU ?Anesthesia Type: General ?Level of consciousness: awake and alert ?Pain management: pain level controlled ?Vital Signs Assessment: post-procedure vital signs reviewed and stable ?Respiratory status: spontaneous breathing, nonlabored ventilation, respiratory function stable and patient connected to nasal cannula oxygen ?Cardiovascular status: blood pressure returned to baseline and stable ?Postop Assessment: no apparent nausea or vomiting ?Anesthetic complications: no ? ? ?No notable events documented. ? ?Last Vitals:  ?Vitals:  ? 12/11/21 0741 12/11/21 1125  ?BP: 114/62 115/77  ?Pulse: 76 66  ?Resp: 14 15  ?Temp: 36.8 ?C 36.7 ?C  ?SpO2: 94% 97%  ?  ?Last Pain:  ?Vitals:  ? 12/11/21 1125  ?TempSrc: Oral  ?PainSc:   ? ? ?  ?  ?  ?  ?  ?  ? ?Elayne Gruver ? ? ? ? ?

## 2021-12-11 NOTE — Progress Notes (Signed)
1 Day Post-Op Procedure(s) (LRB): ?XI ROBOTIC ASSISTED RESECTION OF ANTERIOR MEDIASTINAL MASS - THYMECTOMY (Right) ?Subjective: ?No complaints  ? ?Objective: ?Vital signs in last 24 hours: ?Temp:  [96.9 ?F (36.1 ?C)-98.9 ?F (37.2 ?C)] 98.2 ?F (36.8 ?C) (03/04 0998) ?Pulse Rate:  [60-86] 76 (03/04 0741) ?Cardiac Rhythm: Normal sinus rhythm (03/04 0700) ?Resp:  [13-22] 14 (03/04 0741) ?BP: (114-146)/(62-87) 114/62 (03/04 0741) ?SpO2:  [94 %-99 %] 94 % (03/04 0741) ?Arterial Line BP: (135-148)/(45-61) 138/45 (03/03 1110) ? ?Hemodynamic parameters for last 24 hours: ?  ? ?Intake/Output from previous day: ?03/03 0701 - 03/04 0700 ?In: 2340 [P.O.:240; I.V.:2000; IV Piggyback:100] ?Out: 2955 [Urine:2925; Blood:10; Chest Tube:20] ?Intake/Output this shift: ?No intake/output data recorded. ? ?General appearance: alert, cooperative, and no distress ?Wound: minimal drainage around chest tube site ? ?Lab Results: ?Recent Labs  ?  12/08/21 ?0957 12/11/21 ?0045  ?WBC 5.3 8.3  ?HGB 13.5 10.9*  ?HCT 41.9 34.8*  ?PLT 196 172  ? ?BMET:  ?Recent Labs  ?  12/08/21 ?0957 12/11/21 ?0045  ?NA 138 138  ?K 4.4 4.2  ?CL 107 108  ?CO2 22 25  ?GLUCOSE 102* 117*  ?BUN 19 12  ?CREATININE 0.67 0.67  ?CALCIUM 8.9 8.5*  ?  ?PT/INR:  ?Recent Labs  ?  12/08/21 ?0957  ?LABPROT 12.6  ?INR 0.9  ? ?ABG ?   ?Component Value Date/Time  ? PHART 7.42 12/08/2021 1016  ? HCO3 27.2 12/08/2021 1016  ? O2SAT 97.6 12/08/2021 1016  ? ?CBG (last 3)  ?No results for input(s): GLUCAP in the last 72 hours. ? ?Assessment/Plan: ?S/P Procedure(s) (LRB): ?XI ROBOTIC ASSISTED RESECTION OF ANTERIOR MEDIASTINAL MASS - THYMECTOMY (Right) ?Plan for discharge: see discharge orders ?Doing well ?Dc central line, Foley and chest tube ?Home later this morning ? ? LOS: 1 day  ? ? ?[name] ?12/11/2021 ? ? ?

## 2021-12-11 NOTE — Plan of Care (Signed)
?  Problem: Education: ?Goal: Knowledge of General Education information will improve ?Description: Including pain rating scale, medication(s)/side effects and non-pharmacologic comfort measures ?Outcome: Progressing ?  ?Problem: Health Behavior/Discharge Planning: ?Goal: Ability to manage health-related needs will improve ?Outcome: Progressing ?  ?Problem: Clinical Measurements: ?Goal: Ability to maintain clinical measurements within normal limits will improve ?Outcome: Progressing ?Goal: Will remain free from infection ?Outcome: Progressing ?Goal: Diagnostic test results will improve ?Outcome: Progressing ?Goal: Respiratory complications will improve ?Outcome: Progressing ?Goal: Cardiovascular complication will be avoided ?Outcome: Progressing ?  ?Problem: Activity: ?Goal: Risk for activity intolerance will decrease ?Outcome: Progressing ?  ?Problem: Nutrition: ?Goal: Adequate nutrition will be maintained ?Outcome: Progressing ?  ?Problem: Coping: ?Goal: Level of anxiety will decrease ?Outcome: Progressing ?  ?Problem: Elimination: ?Goal: Will not experience complications related to bowel motility ?Outcome: Progressing ?Goal: Will not experience complications related to urinary retention ?Outcome: Progressing ?  ?Problem: Pain Managment: ?Goal: General experience of comfort will improve ?Outcome: Progressing ?  ?Problem: Safety: ?Goal: Ability to remain free from injury will improve ?Outcome: Progressing ?  ?Problem: Skin Integrity: ?Goal: Risk for impaired skin integrity will decrease ?Outcome: Progressing ?  ?Problem: Activity: ?Goal: Risk for activity intolerance will decrease ?Outcome: Progressing ?  ?Problem: Cardiac: ?Goal: Will achieve and/or maintain hemodynamic stability ?Outcome: Progressing ?  ?Problem: Clinical Measurements: ?Goal: Postoperative complications will be avoided or minimized ?Outcome: Progressing ?  ?Problem: Respiratory: ?Goal: Respiratory status will improve ?Outcome: Progressing ?   ?Problem: Pain Management: ?Goal: Pain level will decrease ?Outcome: Progressing ?  ?Problem: Skin Integrity: ?Goal: Wound healing without signs and symptoms infection will improve ?Outcome: Progressing ?  ?

## 2021-12-11 NOTE — Progress Notes (Signed)
Danae Chen RN aware of order to dc CVC. ?

## 2021-12-11 NOTE — Plan of Care (Signed)
?Problem: Education: ?Goal: Knowledge of General Education information will improve ?Description: Including pain rating scale, medication(s)/side effects and non-pharmacologic comfort measures ?12/11/2021 1327 by Leonie Man, RN ?Outcome: Adequate for Discharge ?12/11/2021 1236 by Leonie Man, RN ?Outcome: Progressing ?  ?Problem: Health Behavior/Discharge Planning: ?Goal: Ability to manage health-related needs will improve ?12/11/2021 1327 by Leonie Man, RN ?Outcome: Adequate for Discharge ?12/11/2021 1236 by Leonie Man, RN ?Outcome: Progressing ?  ?Problem: Clinical Measurements: ?Goal: Ability to maintain clinical measurements within normal limits will improve ?12/11/2021 1327 by Leonie Man, RN ?Outcome: Adequate for Discharge ?12/11/2021 1236 by Leonie Man, RN ?Outcome: Progressing ?Goal: Will remain free from infection ?12/11/2021 1327 by Leonie Man, RN ?Outcome: Adequate for Discharge ?12/11/2021 1236 by Leonie Man, RN ?Outcome: Progressing ?Goal: Diagnostic test results will improve ?12/11/2021 1327 by Leonie Man, RN ?Outcome: Adequate for Discharge ?12/11/2021 1236 by Leonie Man, RN ?Outcome: Progressing ?Goal: Respiratory complications will improve ?12/11/2021 1327 by Leonie Man, RN ?Outcome: Adequate for Discharge ?12/11/2021 1236 by Leonie Man, RN ?Outcome: Progressing ?Goal: Cardiovascular complication will be avoided ?12/11/2021 1327 by Leonie Man, RN ?Outcome: Adequate for Discharge ?12/11/2021 1236 by Leonie Man, RN ?Outcome: Progressing ?  ?Problem: Activity: ?Goal: Risk for activity intolerance will decrease ?12/11/2021 1327 by Leonie Man, RN ?Outcome: Adequate for Discharge ?12/11/2021 1236 by Leonie Man, RN ?Outcome: Progressing ?  ?Problem: Nutrition: ?Goal: Adequate nutrition will be maintained ?12/11/2021 1327 by Leonie Man, RN ?Outcome: Adequate for Discharge ?12/11/2021 1236 by Leonie Man, RN ?Outcome: Progressing ?  ?Problem: Coping: ?Goal: Level of anxiety will  decrease ?12/11/2021 1327 by Leonie Man, RN ?Outcome: Adequate for Discharge ?12/11/2021 1236 by Leonie Man, RN ?Outcome: Progressing ?  ?Problem: Elimination: ?Goal: Will not experience complications related to bowel motility ?12/11/2021 1327 by Leonie Man, RN ?Outcome: Adequate for Discharge ?12/11/2021 1236 by Leonie Man, RN ?Outcome: Progressing ?Goal: Will not experience complications related to urinary retention ?12/11/2021 1327 by Leonie Man, RN ?Outcome: Adequate for Discharge ?12/11/2021 1236 by Leonie Man, RN ?Outcome: Progressing ?  ?Problem: Pain Managment: ?Goal: General experience of comfort will improve ?12/11/2021 1327 by Leonie Man, RN ?Outcome: Adequate for Discharge ?12/11/2021 1236 by Leonie Man, RN ?Outcome: Progressing ?  ?Problem: Safety: ?Goal: Ability to remain free from injury will improve ?12/11/2021 1327 by Leonie Man, RN ?Outcome: Adequate for Discharge ?12/11/2021 1236 by Leonie Man, RN ?Outcome: Progressing ?  ?Problem: Skin Integrity: ?Goal: Risk for impaired skin integrity will decrease ?12/11/2021 1327 by Leonie Man, RN ?Outcome: Adequate for Discharge ?12/11/2021 1236 by Leonie Man, RN ?Outcome: Progressing ?  ?Problem: Activity: ?Goal: Risk for activity intolerance will decrease ?12/11/2021 1327 by Leonie Man, RN ?Outcome: Adequate for Discharge ?12/11/2021 1236 by Leonie Man, RN ?Outcome: Progressing ?  ?Problem: Cardiac: ?Goal: Will achieve and/or maintain hemodynamic stability ?12/11/2021 1327 by Leonie Man, RN ?Outcome: Adequate for Discharge ?12/11/2021 1236 by Leonie Man, RN ?Outcome: Progressing ?  ?Problem: Clinical Measurements: ?Goal: Postoperative complications will be avoided or minimized ?12/11/2021 1327 by Leonie Man, RN ?Outcome: Adequate for Discharge ?12/11/2021 1236 by Leonie Man, RN ?Outcome: Progressing ?  ?Problem: Respiratory: ?Goal: Respiratory status will improve ?12/11/2021 1327 by Leonie Man, RN ?Outcome: Adequate for  Discharge ?12/11/2021 1236 by Leonie Man, RN ?Outcome: Progressing ?  ?Problem: Pain Management: ?Goal: Pain level will  decrease ?12/11/2021 1327 by Leonie Man, RN ?Outcome: Adequate for Discharge ?12/11/2021 1236 by Leonie Man, RN ?Outcome: Progressing ?  ?Problem: Skin Integrity: ?Goal: Wound healing without signs and symptoms infection will improve ?12/11/2021 1327 by Leonie Man, RN ?Outcome: Adequate for Discharge ?12/11/2021 1236 by Leonie Man, RN ?Outcome: Progressing ?  ?

## 2021-12-13 LAB — TYPE AND SCREEN
ABO/RH(D): A NEG
Antibody Screen: NEGATIVE
Unit division: 0
Unit division: 0

## 2021-12-13 LAB — BPAM RBC
Blood Product Expiration Date: 202303262359
Blood Product Expiration Date: 202303292359
Unit Type and Rh: 600
Unit Type and Rh: 600

## 2021-12-16 LAB — SURGICAL PATHOLOGY

## 2021-12-28 ENCOUNTER — Other Ambulatory Visit: Payer: Self-pay | Admitting: Thoracic Surgery (Cardiothoracic Vascular Surgery)

## 2021-12-28 DIAGNOSIS — J9859 Other diseases of mediastinum, not elsewhere classified: Secondary | ICD-10-CM

## 2021-12-29 ENCOUNTER — Ambulatory Visit
Admission: RE | Admit: 2021-12-29 | Discharge: 2021-12-29 | Disposition: A | Discharge status: Home / Self Care | Payer: 59 | Source: Ambulatory Visit | Attending: Thoracic Surgery (Cardiothoracic Vascular Surgery) | Admitting: Thoracic Surgery (Cardiothoracic Vascular Surgery)

## 2021-12-29 ENCOUNTER — Other Ambulatory Visit: Payer: Self-pay

## 2021-12-29 ENCOUNTER — Ambulatory Visit (INDEPENDENT_AMBULATORY_CARE_PROVIDER_SITE_OTHER): Payer: Self-pay | Admitting: Thoracic Surgery (Cardiothoracic Vascular Surgery)

## 2021-12-29 VITALS — BP 131/85 | HR 74 | Resp 20 | Ht 66.0 in | Wt 161.0 lb

## 2021-12-29 DIAGNOSIS — Z9889 Other specified postprocedural states: Secondary | ICD-10-CM

## 2021-12-29 DIAGNOSIS — J9859 Other diseases of mediastinum, not elsewhere classified: Secondary | ICD-10-CM

## 2021-12-29 DIAGNOSIS — Z9089 Acquired absence of other organs: Secondary | ICD-10-CM

## 2021-12-29 NOTE — Progress Notes (Signed)
? ?   ?[address] ?      [place] [address] ?            [phone]   ? ? ?HPI:  Kushi returns for a scheduled follow-up visit after thymectomy ? ?Allura Doepke is a 61 year old woman with a history of reflux, anxiety, and hyperlipidemia.  She is a former smoker having quit in 2011.  She had a CT for coronary calcium scoring.  She was found to have a 2.3 x 1.6 x 2.4 cm anterior mediastinal mass.  On PET/CT the nodule is hypermetabolic. ? ?I did a robotic assisted thymectomy on 12/10/2021.  Postoperative course was uncomplicated and she went home the following day. ? ?She notices some pain in her right upper back when she stands up for a long time.  She is not taking any narcotics.  Mostly using Tylenol and occasionally Advil. ? ?Past Medical History:  ?Diagnosis Date  ? Anxiety   ? Colon polyps   ? GERD (gastroesophageal reflux disease)   ? PONV (postoperative nausea and vomiting)   ? ? ? ?Current Outpatient Medications  ?Medication Sig Dispense Refill  ? ALPRAZolam (XANAX) 0.25 MG tablet Take 0.125 mg by mouth at bedtime as needed for anxiety or sleep.    ? Ascorbic Acid (VITAMIN C) 1000 MG tablet Take 1,000 mg by mouth at bedtime.    ? aspirin EC 81 MG tablet Take 1 tablet (81 mg total) by mouth every morning. Swallow whole.    ? cetirizine (ZYRTEC) 10 MG tablet Take 10 mg by mouth every morning.    ? Cholecalciferol 25 MCG (1000 UT) tablet Take 1,000 Units by mouth at bedtime.    ? citalopram (CELEXA) 20 MG tablet Take 10 mg by mouth every morning.    ? ibuprofen (ADVIL) 200 MG tablet Take 600 mg by mouth every 6 (six) hours as needed (sinus headache).    ? Lifitegrast (XIIDRA) 5 % SOLN Place 1 drop into both eyes 2 (two) times daily.    ? Multiple Vitamins-Minerals (ZINC PO) Take 1 tablet by mouth at bedtime.    ? rosuvastatin (CRESTOR) 10 MG tablet Take 1 tablet (10 mg total) by mouth every morning.    ? TURMERIC PO Take 1,000 mg by mouth at bedtime.    ? zolpidem (AMBIEN) 10 MG tablet Take  10 mg by mouth at bedtime as needed for sleep.    ? ?No current facility-administered medications for this visit.  ? ? ?Physical Exam ?BP 131/85 (BP Location: Left Arm, Patient Position: Sitting)   Pulse 74   Resp 20   Ht '5\' 6"'$  (1.676 m)   Wt 161 lb (73 kg)   SpO2 98% Comment: RA  BMI 25.99 kg/m?  ?Well-appearing 61 year old woman in no acute distress ?Incisions clean dry and intact ? ?Diagnostic Tests: ?I personally reviewed the chest x-ray.  Shows no active disease. ? ?Impression: ?Jennife Zaucha is a 61 year old woman who was incidentally found to have an anterior mediastinal mass on a CT done for coronary calcium scoring.  The nodule was hypermetabolic on PET/CT. ? ?I did a robotic thymectomy on 12/10/2021.  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  It was completely encapsulated with no invasion of surrounding structures. ? ?She is doing great currently.  No restrictions on her activities but she was advised to build into activities gradually.  She is already driving. ? ?I offered to arrange an oncology appointment for her.  There is really no indication for any additional therapy.  She would rather follow-up with me. ? ?Plan: ?Return in 1 year with CT chest ? ?[name], MD ?[facility] ?[phone] ? ? ? ? ?

## 2022-02-07 ENCOUNTER — Encounter (HOSPITAL_BASED_OUTPATIENT_CLINIC_OR_DEPARTMENT_OTHER): Payer: Self-pay

## 2022-03-01 ENCOUNTER — Other Ambulatory Visit: Payer: Self-pay | Admitting: Family Medicine

## 2022-03-01 DIAGNOSIS — Z1231 Encounter for screening mammogram for malignant neoplasm of breast: Secondary | ICD-10-CM

## 2022-03-03 ENCOUNTER — Ambulatory Visit
Admission: RE | Admit: 2022-03-03 | Discharge: 2022-03-03 | Disposition: A | Discharge status: Home / Self Care | Payer: 59 | Source: Ambulatory Visit | Attending: Family Medicine | Admitting: Family Medicine

## 2022-03-03 DIAGNOSIS — Z1231 Encounter for screening mammogram for malignant neoplasm of breast: Secondary | ICD-10-CM

## 2022-03-16 LAB — LIPID PANEL
Chol/HDL Ratio: 1.8 ratio (ref 0.0–4.4)
Cholesterol, Total: 196 mg/dL (ref 100–199)
HDL: 107 mg/dL (ref >39)
LDL Chol Calc (NIH): 79 mg/dL (ref 0–99)
Triglycerides: 51 mg/dL (ref 0–149)
VLDL Cholesterol Cal: 10 mg/dL (ref 5–40)

## 2022-03-16 LAB — HEPATIC FUNCTION PANEL
ALT: 36 IU/L — ABNORMAL HIGH (ref 0–32)
AST: 28 IU/L (ref 0–40)
Albumin: 4.6 g/dL (ref 3.8–4.8)
Alkaline Phosphatase: 100 IU/L (ref 44–121)
Bilirubin Total: 0.4 mg/dL (ref 0.0–1.2)
Bilirubin, Direct: 0.12 mg/dL (ref 0.00–0.40)
Total Protein: 6.7 g/dL (ref 6.0–8.5)

## 2022-11-10 ENCOUNTER — Other Ambulatory Visit: Payer: Self-pay | Admitting: Thoracic Surgery (Cardiothoracic Vascular Surgery)

## 2022-11-10 DIAGNOSIS — Z9089 Acquired absence of other organs: Secondary | ICD-10-CM

## 2022-11-28 ENCOUNTER — Other Ambulatory Visit (HOSPITAL_BASED_OUTPATIENT_CLINIC_OR_DEPARTMENT_OTHER): Payer: Self-pay | Admitting: Cardiology

## 2022-11-28 DIAGNOSIS — I2510 Atherosclerotic heart disease of native coronary artery without angina pectoris: Secondary | ICD-10-CM

## 2022-12-09 ENCOUNTER — Encounter: Payer: Self-pay | Admitting: Thoracic Surgery (Cardiothoracic Vascular Surgery)

## 2022-12-15 ENCOUNTER — Ambulatory Visit
Admission: RE | Admit: 2022-12-15 | Discharge: 2022-12-15 | Disposition: A | Discharge status: Home / Self Care | Payer: 59 | Source: Ambulatory Visit | Attending: Thoracic Surgery (Cardiothoracic Vascular Surgery) | Admitting: Thoracic Surgery (Cardiothoracic Vascular Surgery)

## 2022-12-15 DIAGNOSIS — Z9089 Acquired absence of other organs: Secondary | ICD-10-CM

## 2022-12-20 ENCOUNTER — Ambulatory Visit: Payer: 59 | Admitting: Thoracic Surgery (Cardiothoracic Vascular Surgery)

## 2022-12-20 VITALS — BP 141/93 | HR 80 | Resp 20 | Ht 66.0 in | Wt 155.0 lb

## 2022-12-20 DIAGNOSIS — Z9089 Acquired absence of other organs: Secondary | ICD-10-CM | POA: Diagnosis not present

## 2022-12-20 NOTE — Progress Notes (Signed)
[address]       [place] [address]             [phone]     HPI: Bravery returns for a scheduled follow-up after a thymectomy about a year ago.  Kileigh Tregoning is a 62 year old woman with a history of reflux, anxiety, hyperlipidemia, and a thymoma.  She does have a history of smoking in the past but quit in 2011.  She had a CT for coronary calcium scoring which showed an anterior mediastinal mass.  I did a robotic assisted thymectomy on 12/10/2021.  Pathology showed a type AB thymoma.  Completely encapsulated with no invasion.  She did well postoperatively.  She is feeling well.  She is active.  Watches her diet and weight.  Concerned about finding of hepatic steatosis on her CT.  Past Medical History:  Diagnosis Date   Anxiety    Colon polyps    GERD (gastroesophageal reflux disease)    PONV (postoperative nausea and vomiting)     Current Outpatient Medications  Medication Sig Dispense Refill   ALPRAZolam (XANAX) 0.25 MG tablet Take 0.125 mg by mouth at bedtime as needed for anxiety or sleep.     Ascorbic Acid (VITAMIN C) 1000 MG tablet Take 1,000 mg by mouth at bedtime.     aspirin EC 81 MG tablet Take 1 tablet (81 mg total) by mouth every morning. Swallow whole.     cetirizine (ZYRTEC) 10 MG tablet Take 10 mg by mouth every morning.     Cholecalciferol 25 MCG (1000 UT) tablet Take 1,000 Units by mouth at bedtime.     ibuprofen (ADVIL) 200 MG tablet Take 600 mg by mouth every 6 (six) hours as needed (sinus headache).     Lifitegrast (XIIDRA) 5 % SOLN Place 1 drop into both eyes 2 (two) times daily.     Multiple Vitamins-Minerals (ZINC PO) Take 1 tablet by mouth at bedtime.     Probiotic Product (PROBIOTIC ADVANCED PO) Take by mouth.     TURMERIC PO Take 1,000 mg by mouth at bedtime.     zolpidem (AMBIEN) 10 MG tablet Take 10 mg by mouth at bedtime as needed for sleep.     rosuvastatin (CRESTOR) 10 MG tablet Take 1 tablet (10 mg total) by mouth every  morning.     No current facility-administered medications for this visit.    Physical Exam BP (!) 141/93 (BP Location: Left Arm, Patient Position: Sitting)   Pulse 80   Resp 20   Ht '5\' 6"'$  (1.676 m)   Wt 155 lb (70.3 kg)   SpO2 98% Comment: Ra  BMI 25.81 kg/m  62 year old woman in no acute distress Alert and oriented x 3 with no deficits Lungs clear with equal breath sounds bilaterally Cardiac regular rate and rhythm  Diagnostic Tests: CT CHEST WITHOUT CONTRAST   TECHNIQUE: Multidetector CT imaging of the chest was performed following the standard protocol without IV contrast.   RADIATION DOSE REDUCTION: This exam was performed according to the departmental dose-optimization program which includes automated exposure control, adjustment of the mA and/or kV according to patient size and/or use of iterative reconstruction technique.   COMPARISON:  PET-CT dated December 06, 2021; CT heart dated November 23, 2021   FINDINGS: Cardiovascular: Normal heart size. No pericardial effusion. Normal caliber thoracic aorta with mild atherosclerotic disease.   Mediastinum/Nodes: Small hiatal hernia. Thyroid is unremarkable. No suspicious mediastinal nodule or mass. No enlarged lymph nodes  seen in the chest.   Lungs/Pleura: Central airways are patent. No consolidation, pleural effusion or pneumothorax. Scattered calcified granulomas. No suspicious pulmonary nodules. No pleural nodularity.   Upper Abdomen: Hepatic steatosis.   Musculoskeletal: No chest wall mass or suspicious bone lesions identified.   IMPRESSION: 1. No evidence of recurrent or metastatic disease in the chest. 2. Hepatic steatosis. 3. Mild aortic Atherosclerosis (ICD10-I70.0).     Electronically Signed   By: Yetta Glassman M.D.   On: 12/15/2022 13:34 I personally reviewed the CT images.  No evidence of recurrence.  Some small calcified granulomas.  Impression: Bert Barnaby is a 62 year old woman with a  history of reflux, anxiety, hyperlipidemia, and a thymoma.    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  Will plan to rescan again in a year.  Hepatic steatosis-noted by radiology on her CT.  We discussed the significance of this finding.  Very commonly seen on scans and usually of no great concern.  She already watches her diet and alcohol intake.  Cholesterol well-controlled.  Plan: Return in 1 year with CT chest  [name], MD [facility] [phone]

## 2022-12-27 ENCOUNTER — Other Ambulatory Visit (HOSPITAL_BASED_OUTPATIENT_CLINIC_OR_DEPARTMENT_OTHER): Payer: Self-pay | Admitting: Cardiology

## 2022-12-27 DIAGNOSIS — I2510 Atherosclerotic heart disease of native coronary artery without angina pectoris: Secondary | ICD-10-CM

## 2022-12-27 NOTE — Telephone Encounter (Signed)
Please call pt to schedule overdue 1 year follow-up appointment with Dr. Christopher or APP for refills. Thank you! 

## 2022-12-29 ENCOUNTER — Telehealth: Payer: Self-pay | Admitting: Cardiology

## 2022-12-29 DIAGNOSIS — I2510 Atherosclerotic heart disease of native coronary artery without angina pectoris: Secondary | ICD-10-CM

## 2022-12-29 NOTE — Telephone Encounter (Signed)
*  STAT* If patient is at the pharmacy, call can be transferred to refill team.   1. Which medications need to be refilled? (please list name of each medication and dose if known) rosuvastatin (CRESTOR) 10 MG tablet (Expired)   2. Which pharmacy/location (including street and city if local pharmacy) is medication to be sent to?  [id number] - [place], [place] - [address] AT [place]      3. Do they need a 30 day or 90 day supply? 90   Patient has scheduled appt on 05/02 at 3:10 P.M.

## 2022-12-29 NOTE — Telephone Encounter (Signed)
Returned call to patient, no answer, left message stating no labs needed prior to appointment.

## 2022-12-29 NOTE — Telephone Encounter (Signed)
Patient is wanting to know if she will need labs before her next appt. Requesting return call.

## 2022-12-30 MED ORDER — ROSUVASTATIN CALCIUM 10 MG PO TABS: 10.0000 mg | ORAL_TABLET | Freq: Every morning | ORAL | 1 refills | Status: DC

## 2022-12-30 NOTE — Telephone Encounter (Signed)
Rx request sent to pharmacy.  

## 2022-12-30 NOTE — Telephone Encounter (Signed)
Scheduled 02/09/23 with Laurann Montana, NP

## 2023-02-09 ENCOUNTER — Ambulatory Visit (HOSPITAL_BASED_OUTPATIENT_CLINIC_OR_DEPARTMENT_OTHER): Payer: 59 | Admitting: Family

## 2023-02-09 ENCOUNTER — Encounter (HOSPITAL_BASED_OUTPATIENT_CLINIC_OR_DEPARTMENT_OTHER): Payer: Self-pay | Admitting: Family

## 2023-02-09 VITALS — BP 128/70 | HR 70 | Ht 66.0 in | Wt 159.8 lb

## 2023-02-09 DIAGNOSIS — I2510 Atherosclerotic heart disease of native coronary artery without angina pectoris: Secondary | ICD-10-CM

## 2023-02-09 DIAGNOSIS — E785 Hyperlipidemia, unspecified: Secondary | ICD-10-CM

## 2023-02-09 DIAGNOSIS — R030 Elevated blood-pressure reading, without diagnosis of hypertension: Secondary | ICD-10-CM

## 2023-02-09 NOTE — Patient Instructions (Addendum)
Medication Instructions:  Continue your current medications.   *If you need a refill on your cardiac medications before your next appointment, please call your pharmacy*  Lab Work: Your cholesterol in September looked great!  Testing/Procedures: Your EKG today shows normal sinus rhythm which is a great result!  Follow-Up: At [facility], you and your health needs are our priority.  As part of our continuing mission to provide you with exceptional heart care, we have created designated Provider Care Teams.  These Care Teams include your primary Cardiologist (physician) and Advanced Practice Providers (APPs -  Physician Assistants and Nurse Practitioners) who all work together to provide you with the care you need, when you need it.  We recommend signing up for the patient portal called "MyChart".  Sign up information is provided on this After Visit Summary.  MyChart is used to connect with patients for Virtual Visits (Telemedicine).  Patients are able to view lab/test results, encounter notes, upcoming appointments, etc.  Non-urgent messages can be sent to your provider as well.   To learn more about what you can do with MyChart, go to [email or web address].    Your next appointment:   1 year(s)  Provider:   Jodelle Red, MD    Other Instructions  Heart Healthy Diet Recommendations: A low-salt diet is recommended. Meats should be grilled, baked, or boiled. Avoid fried foods. Focus on lean protein sources like fish or chicken with vegetables and fruits. The American Heart Association is a Chief Technology Officer!  American Heart Association Diet and Lifeystyle Recommendations   Exercise recommendations: The American Heart Association recommends 150 minutes of moderate intensity exercise weekly. Try 30 minutes of moderate intensity exercise 4-5 times per week. This could include walking, jogging, or swimming.

## 2023-02-09 NOTE — Progress Notes (Signed)
Office Visit    Patient Name: [name] Date of Encounter: 02/09/2023  PCP:  Thana Ates, MD   [facility]  Cardiologist:  Jodelle Red, MD  Advanced Practice Provider:  No care team member to display Electrophysiologist:  None      Chief Complaint    [name] is a 62 y.o. female presents today for annual follow-up of coronary disease  Past Medical History    Past Medical History:  Diagnosis Date   Anxiety    Colon polyps    GERD (gastroesophageal reflux disease)    PONV (postoperative nausea and vomiting)    Past Surgical History:  Procedure Laterality Date   CESAREAN SECTION     1989, 1992   MASS EXCISION Right 07/20/2016   Procedure: EXCISION  OF FOREHEAD BONE MASS;  Surgeon: Peggye Form, DO;  Location: [facility];  Service: Plastics;  Laterality: Right;   PARTIAL KNEE ARTHROPLASTY Right 2010   Allergies  Allergies  Allergen Reactions   Cantaloupe Extract Allergy Skin Test Anaphylaxis   Watermelon Flavor Anaphylaxis   Codeine Nausea And Vomiting   Penicillins Rash   History of Present Illness    [name] is a 62 y.o. female with a hx of thymoma s/p thymectomy, coronary calcification, hyperlipidemia, reflux, anxiety,  last seen 11/24/21. Family history of heart disease.   Established with Dr. Cristal Deer 11/11/2021 for chest pain.  Underwent coronary CTA 11/23/2021 with coronary calcium score of 2.28 placing her in the 61st percentile.  There was also noted thymoma. Underwent robotic assisted thymectomy 12/09/2021 with pathology revealing type AB thymoma with Dr. Dorris Fetch. Plan is to monitor with CT scan for 5 years.  Miss today for follow-up independently.  Spending time with her 17-month-old grandson.  Usually exercises at [facility] but has not been as active recently and plans to resume. Follows low sodium diet. Notes taking a lot of Advil recently due to sinus issues,  discussed NSAID use and potential elevation of BP. Reports no shortness of breath nor dyspnea on exertion. Reports no chest pain, pressure, or tightness. No edema, orthopnea, PND. Reports no palpitations.    EKGs/Labs/Other Studies Reviewed:   The following studies were reviewed today: Cardiac Studies & Procedures          CT SCANS  CT CORONARY MORPH W/CTA COR W/SCORE 11/23/2021  Addendum 11/23/2021 12:48 PM ADDENDUM REPORT: 11/23/2021 12:45  CLINICAL DATA:  71F with chest pain and family history of heart disease.  EXAM: Cardiac/Coronary  CT  TECHNIQUE: The patient was scanned on a [employer].  FINDINGS: A 120 kV prospective scan was triggered in the descending thoracic aorta at 111 HU's. Axial non-contrast 3 mm slices were carried out through the heart. The data set was analyzed on a dedicated work station and scored using the Agatson method. Gantry rotation speed was 250 msecs and collimation was .6 mm. No beta blockade and 0.8 mg of sl NTG was given. The 3D data set was reconstructed in 5% intervals of the 67-82 % of the R-R cycle. Diastolic phases were analyzed on a dedicated work station using MPR, MIP and VRT modes. The patient received 80 cc of contrast.  Aorta: Normal size. Ascending aorta 3.1 cm. No calcifications. No dissection.  Aortic Valve:  Trileaflet.  No calcifications.  Coronary Arteries:  Normal coronary origin.  Right dominance.  RCA is a large co-dominant artery that gives rise to PDA and PLVB. There  is no plaque.  Left main is a large artery that gives rise to LAD and LCX arteries.  LAD is a large vessel that has minimal (<25%) calcified plaque in the proximal LAD.  LCX is a co-dominant artery that gives rise to one large OM1 branch. There is no plaque.  Coronary Calcium Score:  Left main: 0  Left anterior descending artery: 2.28  Left circumflex artery: 0  Right coronary artery: 0  Total: 2.28  Percentile: 61st  Other  findings:  Normal pulmonary vein drainage into the left atrium.  Normal let atrial appendage without a thrombus.  Normal size of the pulmonary artery.  Slab reconstruction artifact.  IMPRESSION: 1. Coronary calcium score of 2.28. This was 61st percentile for age-, race-, and sex-matched controls.  2. Normal coronary origin with right dominance.  3. Minimal (<25%) calcified plaque in the proximal LAD. CAD-RADS 1.  4.  Recommend aggressive risk factor modification.  5.  Consider non-ischemic causes of chest pain.  Chilton Si   Electronically Signed By: Chilton Si M.D. On: 11/23/2021 12:45  Narrative EXAM: OVER-READ INTERPRETATION  CT CHEST  The following report is an over-read performed by radiologist Dr. Irish Lack of [facility] on 11/23/2021. This over-read does not include interpretation of cardiac or coronary anatomy or pathology. The coronary CTA interpretation by the cardiologist is attached.  COMPARISON:  CT of the chest on 12/26/2013  FINDINGS: Vascular: No significant noncardiac vascular findings.  Mediastinum/Nodes: Visualized mediastinum and hilar regions demonstrate an oval-shaped anterior mediastinal mass just anterior to the ascending thoracic aorta on axial images 3-14 not present on the prior chest CT. This soft tissue measures approximately 2.3 x 1.6 x 2.4 cm. Statistically this most likely represents an enlarged lymph node. In the anterior mediastinum, thymoma is also in the differential. No other lymphadenopathy visualized. However, the entire mediastinum and hilar regions are not imaged on the current study. There is a stable small hiatal hernia.  Lungs/Pleura: Stable calcified left upper lobe granuloma. Visualized lungs show no evidence of pulmonary edema, consolidation, pneumothorax or pleural fluid.  Upper Abdomen: No acute abnormality.  Musculoskeletal: No chest wall mass or suspicious bone  lesions identified.  IMPRESSION: Soft tissue mass in the visualized anterior mediastinum just to the right of midline and anterior to the ascending thoracic aorta with estimated dimensions of approximately 2.3 x 1.6 x 2.4 cm. This statistically likely represents an enlarged lymph node. Initial evaluation is recommended with a CT of the entire chest with IV contrast. Correlation also suggested with any palpable lymphadenopathy in other areas of the body by physical examination which may lead to a need to image additional parts of the body.  Electronically Signed: By: Irish Lack M.D. On: 11/23/2021 11:05           EKG:  EKG is  ordered today.  The ekg ordered today demonstrates NSR 70 bpm with no acute St/T wave changes.   Recent Labs: 03/15/2022: ALT 36  Recent Lipid Panel    Component Value Date/Time   CHOL 196 03/15/2022 0818   TRIG 51 03/15/2022 0818   HDL 107 03/15/2022 0818   CHOLHDL 1.8 03/15/2022 0818   LDLCALC 79 03/15/2022 0818    Home Medications   Current Meds  Medication Sig   ALPRAZolam (XANAX) 0.25 MG tablet Take 0.125 mg by mouth at bedtime as needed for anxiety or sleep.   Ascorbic Acid (VITAMIN C) 1000 MG tablet Take 1,000 mg by mouth at bedtime.  aspirin EC 81 MG tablet Take 1 tablet (81 mg total) by mouth every morning. Swallow whole.   cetirizine (ZYRTEC) 10 MG tablet Take 10 mg by mouth every morning.   Cholecalciferol 25 MCG (1000 UT) tablet Take 1,000 Units by mouth at bedtime.   ibuprofen (ADVIL) 200 MG tablet Take 600 mg by mouth every 6 (six) hours as needed (sinus headache).   Multiple Vitamins-Minerals (ZINC PO) Take 1 tablet by mouth at bedtime.   Probiotic Product (PROBIOTIC ADVANCED PO) Take by mouth.   rosuvastatin (CRESTOR) 10 MG tablet Take 1 tablet (10 mg total) by mouth every morning.   TURMERIC PO Take 1,000 mg by mouth at bedtime.   zolpidem (AMBIEN) 10 MG tablet Take 10 mg by mouth at bedtime as needed for sleep.     Review  of Systems      All other systems reviewed and are otherwise negative except as noted above.  Physical Exam    VS:  BP 128/70   Pulse 70   Ht 5\' 6"  (1.676 m)   Wt 159 lb 12.8 oz (72.5 kg)   BMI 25.79 kg/m  , BMI Body mass index is 25.79 kg/m.  Wt Readings from Last 3 Encounters:  02/09/23 159 lb 12.8 oz (72.5 kg)  12/20/22 155 lb (70.3 kg)  12/29/21 161 lb (73 kg)    GEN: Well nourished, well developed, in no acute distress. HEENT: normal. Neck: Supple, no JVD, carotid bruits, or masses. Cardiac: RRR, no murmurs, rubs, or gallops. No clubbing, cyanosis, edema.  Radials/PT 2+ and equal bilaterally.  Respiratory:  Respirations regular and unlabored, clear to auscultation bilaterally. GI: Soft, nontender, nondistended. MS: No deformity or atrophy. Skin: Warm and dry, no rash. Neuro:  Strength and sensation are intact. Psych: Normal affect.  Assessment & Plan    Coronary artery calcification - Stable with no anginal symptoms. No indication for ischemic evaluation.  GDMT Aspirin, Rosuvastatin. Heart healthy diet and regular cardiovascular exercise encouraged.    HLD - 06/2022 LDL 67. Continue Rosuvastatin 10mg  daily.   Elevated blood pressure without diagnosis of hypertension -initial BP elevated 144/76 which improved to 128/70 without intervention.  Has been using a lot of Advil recently due to sinus issues.  We discussed the role NSAID use could play in elevating blood pressure. Often higher in medical office. Discussed to monitor BP at home at least 2 hours after medications and sitting for 5-10 minutes. Will check in via MyChart in 2 weeks. If Bp persistently >130/80, plan to add Amlodipine.        Disposition: Follow up in 1 year(s) with Jodelle Red, MD or APP.  Signed, Alver Sorrow, NP 02/09/2023, 3:49 PM [facility]

## 2023-02-13 ENCOUNTER — Other Ambulatory Visit: Payer: Self-pay | Admitting: Thoracic Surgery (Cardiothoracic Vascular Surgery)

## 2023-02-13 DIAGNOSIS — Z1231 Encounter for screening mammogram for malignant neoplasm of breast: Secondary | ICD-10-CM

## 2023-02-23 ENCOUNTER — Encounter (HOSPITAL_BASED_OUTPATIENT_CLINIC_OR_DEPARTMENT_OTHER): Payer: Self-pay

## 2023-03-20 ENCOUNTER — Ambulatory Visit
Admission: RE | Admit: 2023-03-20 | Discharge: 2023-03-20 | Disposition: A | Discharge status: Home / Self Care | Payer: 59 | Source: Ambulatory Visit | Attending: Thoracic Surgery (Cardiothoracic Vascular Surgery) | Admitting: Thoracic Surgery (Cardiothoracic Vascular Surgery)

## 2023-03-20 DIAGNOSIS — Z1231 Encounter for screening mammogram for malignant neoplasm of breast: Secondary | ICD-10-CM

## 2023-06-27 ENCOUNTER — Other Ambulatory Visit: Payer: Self-pay | Admitting: Cardiology

## 2023-06-27 ENCOUNTER — Other Ambulatory Visit: Payer: Self-pay | Admitting: *Deleted

## 2023-06-27 DIAGNOSIS — I2510 Atherosclerotic heart disease of native coronary artery without angina pectoris: Secondary | ICD-10-CM

## 2023-11-13 ENCOUNTER — Other Ambulatory Visit: Payer: Self-pay | Admitting: Thoracic Surgery (Cardiothoracic Vascular Surgery)

## 2023-11-13 DIAGNOSIS — Z9089 Acquired absence of other organs: Secondary | ICD-10-CM

## 2023-12-13 ENCOUNTER — Encounter: Payer: Self-pay | Admitting: Thoracic Surgery (Cardiothoracic Vascular Surgery)

## 2023-12-19 IMAGING — MG MM DIGITAL SCREENING BILAT W/ TOMO AND CAD
8 series · 8 of 24 positions shown · non-contrast
Comparison: Previous exam(s).

CLINICAL DATA: Screening.

EXAM:
DIGITAL SCREENING BILATERAL MAMMOGRAM WITH TOMOSYNTHESIS AND CAD
TECHNIQUE: Bilateral screening digital craniocaudal and mediolateral oblique
mammograms were obtained. Bilateral screening digital breast
tomosynthesis was performed. The images were evaluated with
computer-aided detection.

[R MLO synth-2D]
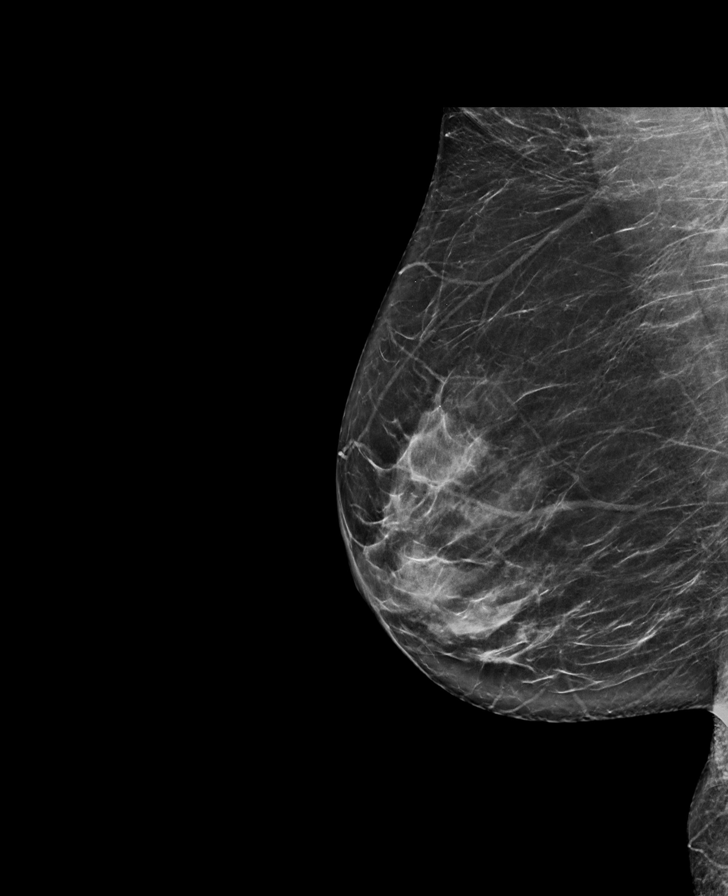

[R CC synth-2D]
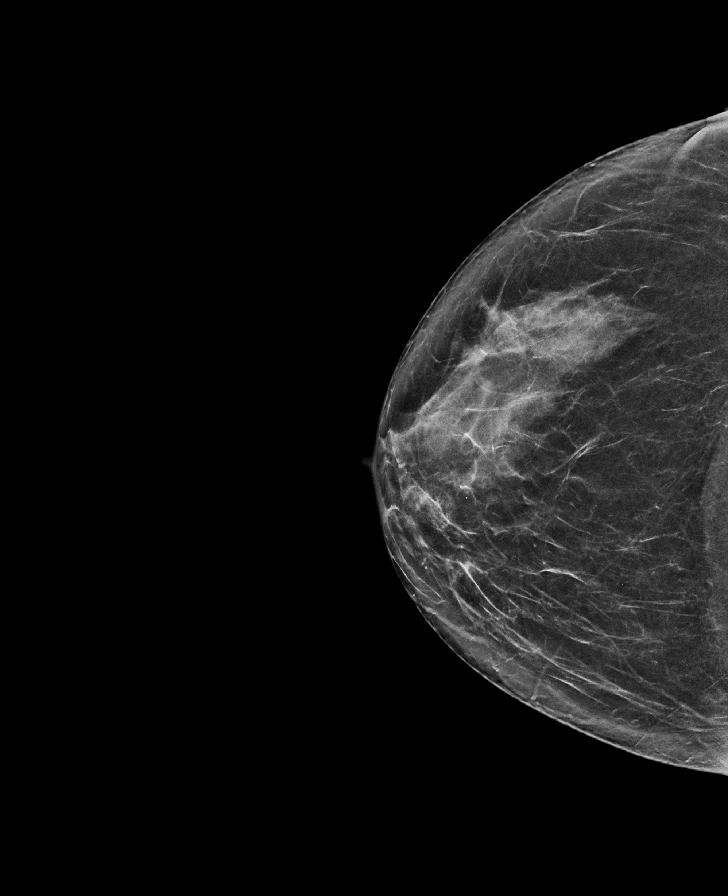

[L MLO synth-2D]
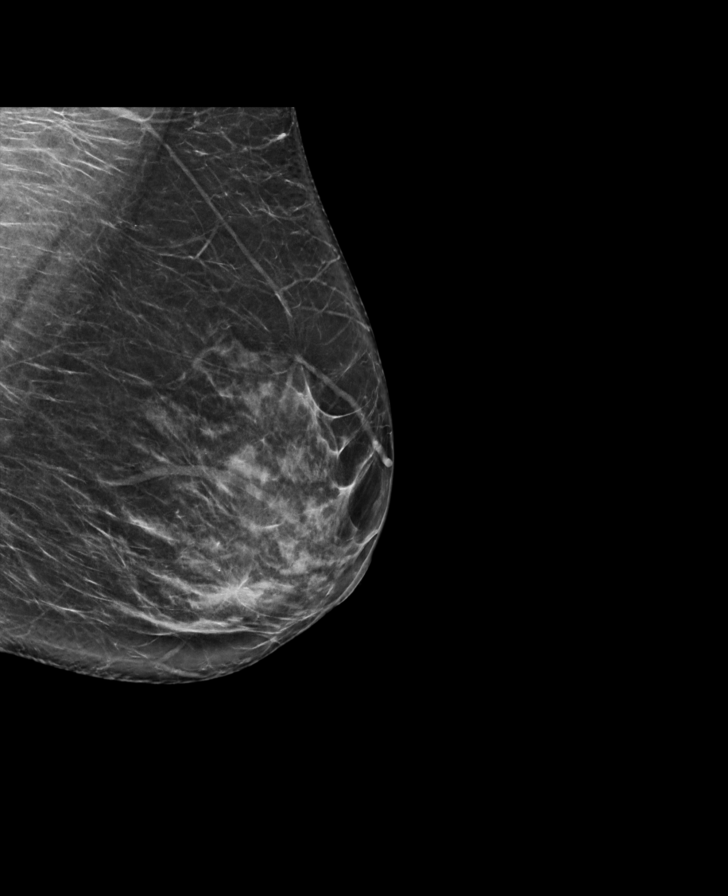

[L CC synth-2D]
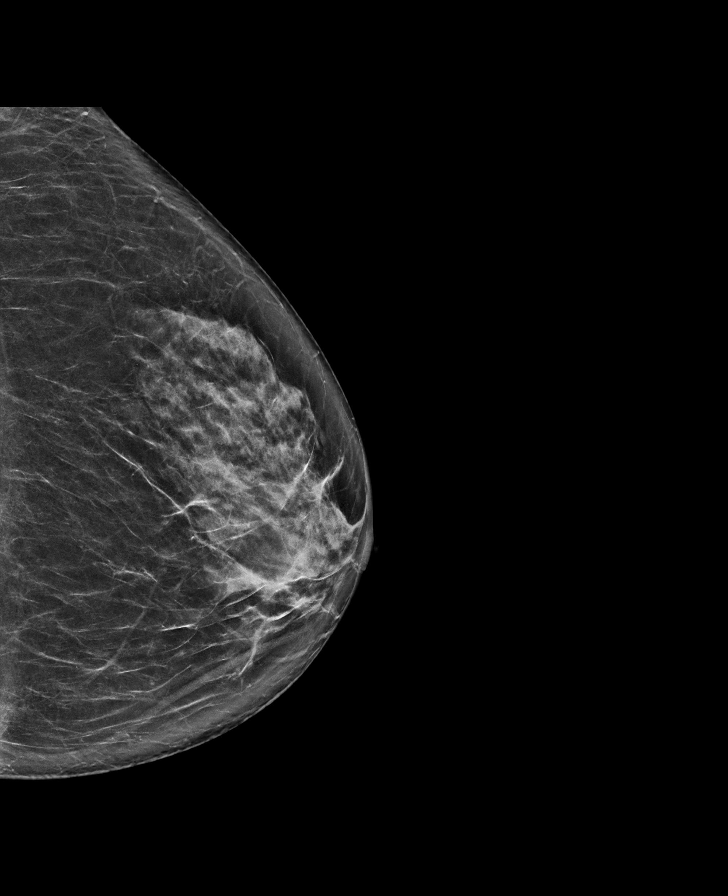

[R CC tomo · tomo slice 37/72.0]
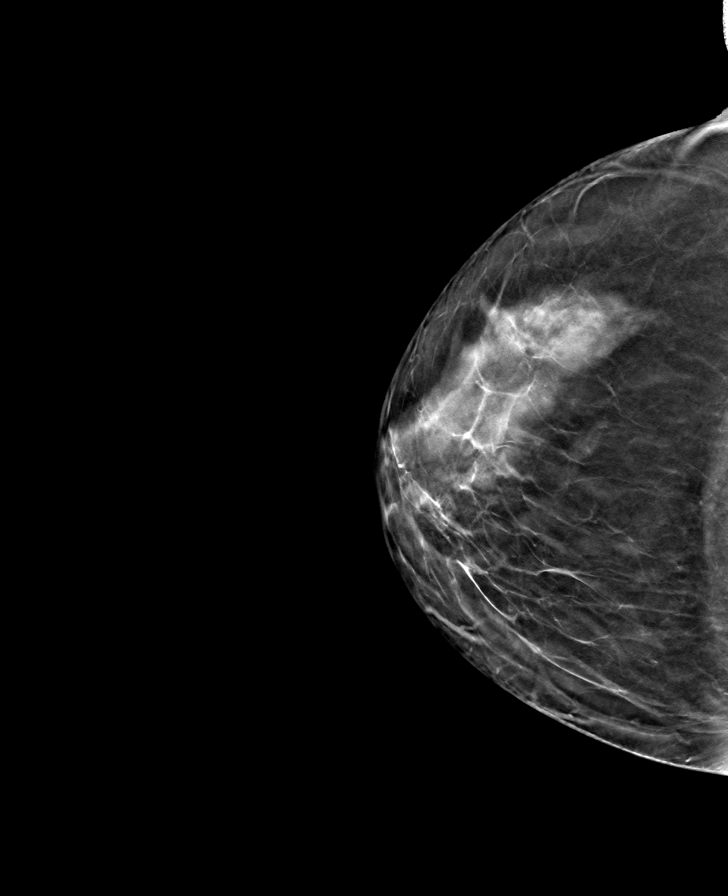

[L CC tomo · tomo slice 36/71.0]
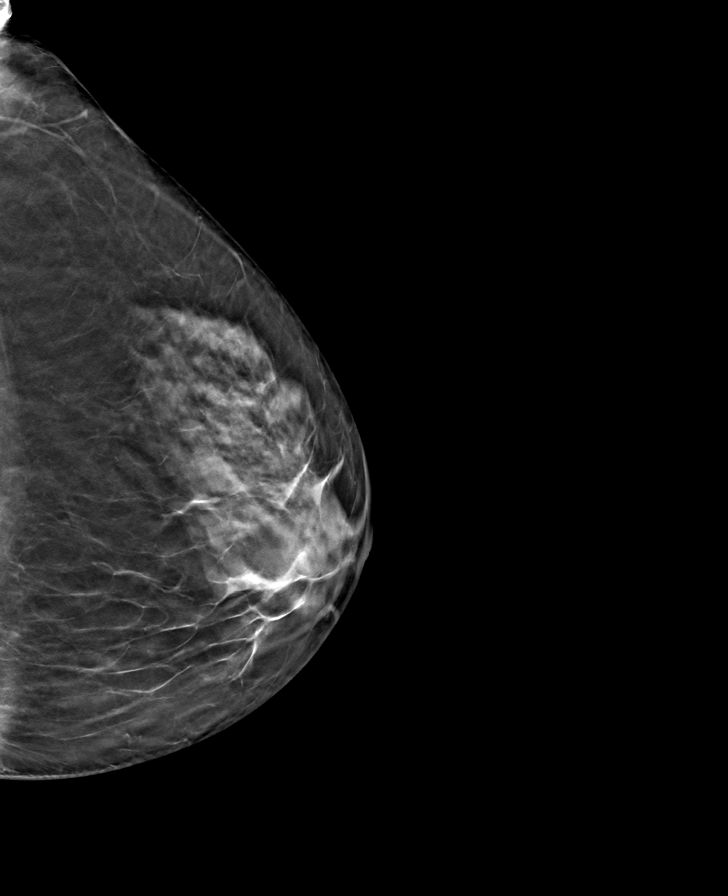

[L MLO tomo · tomo slice 38/75.0]
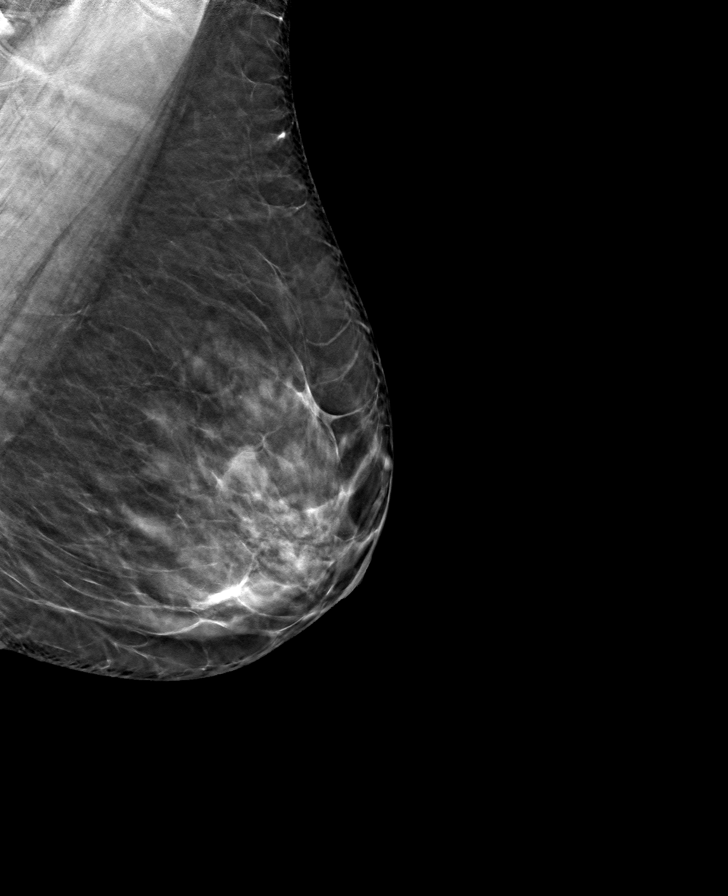

[R MLO tomo · tomo slice 37/74.0]
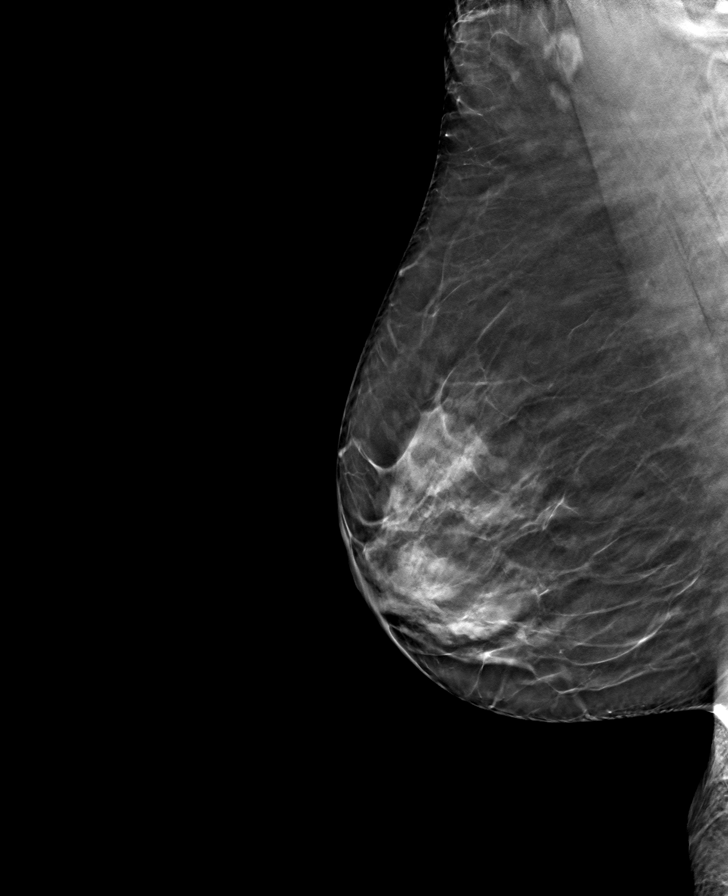

[8 of 24 positions shown; findings below may reference images not displayed]

ACR Breast Density Category c: The breast tissue is heterogeneously
dense, which may obscure small masses.
FINDINGS: There are no findings suspicious for malignancy.
IMPRESSION: No mammographic evidence of malignancy. A result letter of this
screening mammogram will be mailed directly to the patient.

RECOMMENDATION:
Screening mammogram in one year. (Code:[id number])

BI-RADS CATEGORY  1: Negative.

## 2023-12-20 ENCOUNTER — Ambulatory Visit
Admission: RE | Admit: 2023-12-20 | Discharge: 2023-12-20 | Disposition: A | Discharge status: Home / Self Care | Payer: 59 | Source: Ambulatory Visit | Attending: Thoracic Surgery (Cardiothoracic Vascular Surgery) | Admitting: Thoracic Surgery (Cardiothoracic Vascular Surgery)

## 2023-12-20 DIAGNOSIS — Z9089 Acquired absence of other organs: Secondary | ICD-10-CM

## 2023-12-26 ENCOUNTER — Encounter: Payer: Self-pay | Admitting: Thoracic Surgery (Cardiothoracic Vascular Surgery)

## 2023-12-26 ENCOUNTER — Ambulatory Visit: Payer: 59 | Admitting: Thoracic Surgery (Cardiothoracic Vascular Surgery)

## 2023-12-26 VITALS — BP 179/97 | HR 89 | Resp 20 | Ht 66.0 in | Wt 159.8 lb

## 2023-12-26 DIAGNOSIS — Z87898 Personal history of other specified conditions: Secondary | ICD-10-CM | POA: Diagnosis not present

## 2023-12-26 NOTE — Progress Notes (Signed)
 [address]       [place] [address]             [phone]    HPI: Cardelia returns for follow-up after thymectomy.  Mida Cory is a 63 year old woman with a history of reflux, anxiety, hyperlipidemia, and a thymoma.  Remote history of smoking.  Quit in 2011.  CT for coronary calcium scoring showed an anterior mediastinal mass.  She underwent robotic assisted thymectomy on 12/10/2021.  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  Feeling well.  Remains active.  Past Medical History:  Diagnosis Date   Anxiety    Colon polyps    GERD (gastroesophageal reflux disease)    PONV (postoperative nausea and vomiting)      Current Outpatient Medications  Medication Sig Dispense Refill   ALPRAZolam (XANAX) 0.25 MG tablet Take 0.125 mg by mouth at bedtime as needed for anxiety or sleep.     Ascorbic Acid (VITAMIN C) 1000 MG tablet Take 1,000 mg by mouth at bedtime.     aspirin EC 81 MG tablet Take 1 tablet (81 mg total) by mouth every morning. Swallow whole.     cetirizine (ZYRTEC) 10 MG tablet Take 10 mg by mouth every morning.     Cholecalciferol 25 MCG (1000 UT) tablet Take 1,000 Units by mouth at bedtime.     ibuprofen (ADVIL) 200 MG tablet Take 600 mg by mouth every 6 (six) hours as needed (sinus headache).     Multiple Vitamins-Minerals (ZINC PO) Take 1 tablet by mouth at bedtime.     Probiotic Product (PROBIOTIC ADVANCED PO) Take by mouth.     TURMERIC PO Take 1,000 mg by mouth at bedtime.     zolpidem (AMBIEN) 10 MG tablet Take 10 mg by mouth at bedtime as needed for sleep.     rosuvastatin (CRESTOR) 10 MG tablet Take 1 tablet (10 mg total) by mouth every morning. 90 tablet 2   No current facility-administered medications for this visit.    Physical Exam BP (!) 179/97 (BP Location: Right Arm, Patient Position: Sitting, Cuff Size: Normal)   Pulse 89   Resp 20   Ht 5\' 6"  (1.676 m)   Wt 159 lb 12.8 oz (72.5 kg)   SpO2 98% Comment: RA  BMI  25.79 kg/m  Well-appearing 63 year old woman in no acute distress Appears younger than stated age Alert and oriented x 3 with no focal deficits Lungs clear with equal breath sounds bilaterally Cardiac regular rate and rhythm  Diagnostic Tests: CT CHEST WITHOUT CONTRAST   TECHNIQUE: Multidetector CT imaging of the chest was performed following the standard protocol without IV contrast.   RADIATION DOSE REDUCTION: This exam was performed according to the departmental dose-optimization program which includes automated exposure control, adjustment of the mA and/or kV according to patient size and/or use of iterative reconstruction technique.   COMPARISON:  PET-CT 12/06/2021.   FINDINGS: Cardiovascular: No significant vascular findings. Normal heart size. No pericardial effusion.   Mediastinum/Nodes: No enlarged mediastinal or axillary lymph nodes. Thyroid gland, trachea, and esophagus demonstrate no significant findings. There is a small hiatal hernia. There are no anterior mediastinal masses.   Lungs/Pleura: There are bilateral calcified pulmonary nodules similar to prior. The lungs are otherwise clear. There is no pleural effusion or pneumothorax.   Upper Abdomen: There are calcified granulomas in the liver.   Musculoskeletal: No chest wall mass or suspicious bone lesions identified.   IMPRESSION: 1. No evidence  for recurrent or metastatic disease. 2. Evidence of prior granulomatous disease. 3. Small hiatal hernia.     Electronically Signed   By: Darliss Cheney M.D.   On: 12/21/2023 00:11  I personally reviewed the CT images.  No evidence of recurrence.  Stable calcified granulomas.   Impression: Aritza Brunet is a 63 year old woman with a history of reflux, anxiety, hyperlipidemia, and a thymoma.   Type AB thymoma status post thymectomy-no evidence of recurrence.  Will continue to follow, however I think that odds of recurrence are extremely low given the tumor  was completely encapsulated with no invasion of surrounding structures and relatively benign cell type.  Elevated blood pressure-has whitecoat hypertension.  She is can check herself when she gets home.  If it remains elevated she will let me know.  Plan: Return in 1 year with CT chest  Loreli Slot, MD [facility] [phone]

## 2024-02-27 ENCOUNTER — Other Ambulatory Visit: Payer: Self-pay | Admitting: Internal Medicine

## 2024-02-27 DIAGNOSIS — Z1231 Encounter for screening mammogram for malignant neoplasm of breast: Secondary | ICD-10-CM

## 2024-03-12 ENCOUNTER — Ambulatory Visit (HOSPITAL_BASED_OUTPATIENT_CLINIC_OR_DEPARTMENT_OTHER): Admitting: Nurse Practitioner

## 2024-03-12 ENCOUNTER — Encounter (HOSPITAL_BASED_OUTPATIENT_CLINIC_OR_DEPARTMENT_OTHER): Payer: Self-pay | Admitting: Nurse Practitioner

## 2024-03-12 VITALS — BP 136/82 | HR 72 | Ht 66.0 in | Wt 156.0 lb

## 2024-03-12 DIAGNOSIS — R030 Elevated blood-pressure reading, without diagnosis of hypertension: Secondary | ICD-10-CM

## 2024-03-12 DIAGNOSIS — I2510 Atherosclerotic heart disease of native coronary artery without angina pectoris: Secondary | ICD-10-CM | POA: Diagnosis not present

## 2024-03-12 DIAGNOSIS — E785 Hyperlipidemia, unspecified: Secondary | ICD-10-CM

## 2024-03-12 DIAGNOSIS — F419 Anxiety disorder, unspecified: Secondary | ICD-10-CM

## 2024-03-12 MED ORDER — ROSUVASTATIN CALCIUM 10 MG PO TABS: 15.0000 mg | ORAL_TABLET | Freq: Every morning | ORAL | 3 refills | Status: AC

## 2024-03-12 NOTE — Patient Instructions (Signed)
 Medication Instructions:   RESTART Asprin EC one (1) tablet by mouth ( 81 mg ) daily.   INCREASE Rosuvastatin  one and one half tablet ( 1.5) by mouth ( 15 mg ) daily.   *If you need a refill on your cardiac medications before your next appointment, please call your pharmacy*  Lab Work:  None ordered.  If you have labs (blood work) drawn today and your tests are completely normal, you will receive your results only by: MyChart Message (if you have MyChart) OR A paper copy in the mail If you have any lab test that is abnormal or we need to change your treatment, we will call you to review the results.  Testing/Procedures:  None ordered.  Follow-Up: At [facility], you and your health needs are our priority.  As part of our continuing mission to provide you with exceptional heart care, our providers are all part of one team.  This team includes your primary Cardiologist (physician) and Advanced Practice Providers or APPs (Physician Assistants and Nurse Practitioners) who all work together to provide you with the care you need, when you need it.  Your next appointment:   1 year(s)  Provider:   Sheryle Donning, MD, Slater Duncan, NP, or Neomi Banks, NP    We recommend signing up for the patient portal called "MyChart".  Sign up information is provided on this After Visit Summary.  MyChart is used to connect with patients for Virtual Visits (Telemedicine).  Patients are able to view lab/test results, encounter notes, upcoming appointments, etc.  Non-urgent messages can be sent to your provider as well.   To learn more about what you can do with MyChart, go to [email or web address].   Other Instructions  Your physician wants you to follow-up in: 1 year.  You will receive a reminder letter in the mail two months in advance. If you don't receive a letter, please call our office to schedule the follow-up appointment.

## 2024-03-12 NOTE — Progress Notes (Signed)
 Cardiology Office Note   Date:  03/12/2024  ID:  Norma, Lewis 14-Nov-1960, MRN [id number] PCP: Elester Grim, MD  [facility] Providers Cardiologist:  Sheryle Donning, MD     [facility] Coronary artery disease Coronary CTA 11/23/2021 CAC score 2.28 (61st percentile) Thymoma noted on CCTA S/p thyectomy 12/09/2021 (type AB thymoma) Hyperlipidemia Anxiety Family history of heart disease in father  Established with Dr. Veryl Gottron 11/11/2021 for chest pain.  Family history of heart disease.  Underwent coronary CTA 11/23/2021 with coronary calcium  score of 2.28 placing her in 61st percentile.  Also noted thymoma.  She underwent robotic assisted thymectomy 12/09/2021 with pathology revealing type AB thymoma with Dr. Luna Salinas.  Plan to monitor with CT every 5 years.  Seen in clinic 02/09/2023 by Neomi Banks, NP.  She reported she had not been as active recently but planned to resume exercising at [employer] and spending time with her 73-month-old grandson.  She reported low-sodium diet.  Admitted to frequent NSAID use due to sinus issues.  BP was elevated at 144/76 with improvement to 128/70.  She was asked to monitor home BP and report elevations in 2 weeks.  History of Present Illness History of Present Illness [name] is a very pleasant 63 year old female who returns today for follow-up of coronary artery calcification. She has experienced elevated blood pressure readings in clinical settings, most recently when she saw Dr. Luna Salinas 12/26/23. She has been monitoring at home with highest reading 135 systolic.  She attributes high readings in the clinic to anxiety and 'white coat hypertension'.  She also has elevations in heart rate when driving to appointments while HR at home is typically in the 60s. She manages anxiety and panic attacks with infrequent use of Xanax  (alprazolam ). Family history of early deaths, with both parents passing away at 50 years old and  her brother also passing away at a young age which makes her anxious when going to medical appointments.  She maintains a healthy lifestyle with regular exercise and healthy diet.  He denies chest pain, palpitations, shortness of breath, orthopnea, PND, edema, presyncope, syncope.  ROS: See HPI  Studies Reviewed EKG Interpretation Date/Time:  Tuesday March 12 2024 13:38:24 EDT Ventricular Rate:  76 PR Interval:  128 QRS Duration:  80 QT Interval:  352 QTC Calculation: 396 R Axis:   23  Text Interpretation: Normal sinus rhythm Normal ECG When compared with ECG of 08-Dec-2021 10:05, No significant change was found Confirmed by Slater Duncan [phone]) on 03/12/2024 1:48:37 PM     No results found for: "LIPOA"  Risk Assessment/Calculations           Physical Exam VS:  BP 136/82   Pulse 72   Ht 5\' 6"  (1.676 m)   Wt 156 lb (70.8 kg)   SpO2 99%   BMI 25.18 kg/m    Wt Readings from Last 3 Encounters:  03/12/24 156 lb (70.8 kg)  12/26/23 159 lb 12.8 oz (72.5 kg)  02/09/23 159 lb 12.8 oz (72.5 kg)    GEN: Well nourished, well developed in no acute distress NECK: No JVD; No carotid bruits CARDIAC: RRR, no murmurs, rubs, gallops RESPIRATORY:  Clear to auscultation without rales, wheezing or rhonchi  ABDOMEN: Soft, non-tender, non-distended EXTREMITIES:  No edema; No deformity   ASSESSMENT AND PLAN Assessment & Plan Hyperlipidemia LDL goal < 70 Last lipid panel 06/20/2023 with total cholesterol 189, HDL 96, LDL-C 82, and triglycerides 51.  We discussed LDL goals  on the basis of minimal coronary calcification of 2. Advised her of the guidelines for LDL < 100 and LDL < 70. She would like to be more aggressive with lipid management for goal LDL < 70.  Will increase rosuvastatin  to 15 mg daily (1 1/2 10 mg tablets daily). She is scheduled for repeat fasting lipid panel in Sept with PCP. Advised her to notify us  if she has questions or concerns following these labs.   CAD Coronary CTA  11/23/2021 with CAC score of 3.280 (61st percentile), minimal (< 25%) calcified plaque in proximal LAD. She denies chest pain, dyspnea, or other symptoms concerning for angina.  No indication for further ischemic evaluation at this time.  We discussed the guidelines for calcium  score > 0 but < 100. She would like to be aggressive in managing CAD. As noted above, she is going to increase rosuvastatin  to 15 mg daily for goal LDL < 70.  She would like to resume aspirin  81 mg daily.  Elevated BP without diagnosis of Hypertension   BP initially elevated but improved some on recheck.  She has had multiple office visits with other providers with elevated BP, home BP consistently  < 135/80.  Encouraged her to continue home blood pressure monitoring once weekly. No indication for anti-hypertensive therapy at this time.   Anxiety   Anxiety contributes to elevated heart rate and blood pressure in clinical settings. Xanax  is used sparingly and is effective for acute episodes. Management per PCP.          Dispo: 1 year with Dr. Veryl Gottron  Signed, Slater Duncan, NP-C

## 2024-03-14 DIAGNOSIS — L570 Actinic keratosis: Secondary | ICD-10-CM | POA: Diagnosis not present

## 2024-03-14 DIAGNOSIS — D1801 Hemangioma of skin and subcutaneous tissue: Secondary | ICD-10-CM | POA: Diagnosis not present

## 2024-03-14 DIAGNOSIS — Z85828 Personal history of other malignant neoplasm of skin: Secondary | ICD-10-CM | POA: Diagnosis not present

## 2024-03-14 DIAGNOSIS — L400 Psoriasis vulgaris: Secondary | ICD-10-CM | POA: Diagnosis not present

## 2024-03-14 DIAGNOSIS — L821 Other seborrheic keratosis: Secondary | ICD-10-CM | POA: Diagnosis not present

## 2024-03-14 DIAGNOSIS — M7130 Other bursal cyst, unspecified site: Secondary | ICD-10-CM | POA: Diagnosis not present

## 2024-03-27 ENCOUNTER — Ambulatory Visit
Admission: RE | Admit: 2024-03-27 | Discharge: 2024-03-27 | Disposition: A | Discharge status: Home / Self Care | Source: Ambulatory Visit | Attending: Internal Medicine

## 2024-03-27 DIAGNOSIS — Z1231 Encounter for screening mammogram for malignant neoplasm of breast: Secondary | ICD-10-CM | POA: Diagnosis not present

## 2024-05-13 DIAGNOSIS — H2512 Age-related nuclear cataract, left eye: Secondary | ICD-10-CM | POA: Diagnosis not present

## 2024-05-13 DIAGNOSIS — Z961 Presence of intraocular lens: Secondary | ICD-10-CM | POA: Diagnosis not present

## 2024-05-13 DIAGNOSIS — H25012 Cortical age-related cataract, left eye: Secondary | ICD-10-CM | POA: Diagnosis not present

## 2024-06-12 DIAGNOSIS — R030 Elevated blood-pressure reading, without diagnosis of hypertension: Secondary | ICD-10-CM | POA: Diagnosis not present

## 2024-06-12 DIAGNOSIS — F419 Anxiety disorder, unspecified: Secondary | ICD-10-CM | POA: Diagnosis not present

## 2024-06-12 DIAGNOSIS — R519 Headache, unspecified: Secondary | ICD-10-CM | POA: Diagnosis not present

## 2024-06-21 DIAGNOSIS — R030 Elevated blood-pressure reading, without diagnosis of hypertension: Secondary | ICD-10-CM | POA: Diagnosis not present

## 2024-06-21 DIAGNOSIS — R519 Headache, unspecified: Secondary | ICD-10-CM | POA: Diagnosis not present

## 2024-06-21 DIAGNOSIS — D4989 Neoplasm of unspecified behavior of other specified sites: Secondary | ICD-10-CM | POA: Diagnosis not present

## 2024-06-21 DIAGNOSIS — Z23 Encounter for immunization: Secondary | ICD-10-CM | POA: Diagnosis not present

## 2024-06-21 DIAGNOSIS — K760 Fatty (change of) liver, not elsewhere classified: Secondary | ICD-10-CM | POA: Diagnosis not present

## 2024-06-21 DIAGNOSIS — Z1159 Encounter for screening for other viral diseases: Secondary | ICD-10-CM | POA: Diagnosis not present

## 2024-06-21 DIAGNOSIS — I2510 Atherosclerotic heart disease of native coronary artery without angina pectoris: Secondary | ICD-10-CM | POA: Diagnosis not present

## 2024-06-21 DIAGNOSIS — F419 Anxiety disorder, unspecified: Secondary | ICD-10-CM | POA: Diagnosis not present

## 2024-06-21 DIAGNOSIS — C439 Malignant melanoma of skin, unspecified: Secondary | ICD-10-CM | POA: Diagnosis not present

## 2024-06-21 DIAGNOSIS — I700 Atherosclerosis of aorta: Secondary | ICD-10-CM | POA: Diagnosis not present

## 2024-06-21 DIAGNOSIS — Z0000 Encounter for general adult medical examination without abnormal findings: Secondary | ICD-10-CM | POA: Diagnosis not present

## 2024-06-21 DIAGNOSIS — K219 Gastro-esophageal reflux disease without esophagitis: Secondary | ICD-10-CM | POA: Diagnosis not present

## 2024-06-27 DIAGNOSIS — E2839 Other primary ovarian failure: Secondary | ICD-10-CM | POA: Diagnosis not present

## 2024-06-27 DIAGNOSIS — Z780 Asymptomatic menopausal state: Secondary | ICD-10-CM | POA: Diagnosis not present

## 2024-07-23 DIAGNOSIS — J988 Other specified respiratory disorders: Secondary | ICD-10-CM | POA: Diagnosis not present
